# Patient Record
Sex: Female | Born: 1980 | Race: White | Hispanic: No | Marital: Single | Smoking: Never smoker
Health system: Southern US, Community
[De-identification: ages and names within clinical notes are randomized; demographics above are authoritative.]

---

## 2003-04-16 ENCOUNTER — Other Ambulatory Visit: Admission: RE | Admit: 2003-04-16 | Discharge: 2003-04-16 | Payer: Self-pay | Admitting: Gynecology

## 2004-06-17 ENCOUNTER — Other Ambulatory Visit: Admission: RE | Admit: 2004-06-17 | Discharge: 2004-06-17 | Payer: Self-pay | Admitting: Gynecology

## 2005-07-13 ENCOUNTER — Other Ambulatory Visit: Admission: RE | Admit: 2005-07-13 | Discharge: 2005-07-13 | Payer: Self-pay | Admitting: Gynecology

## 2006-10-22 ENCOUNTER — Emergency Department (HOSPITAL_COMMUNITY): Admission: EM | Admit: 2006-10-22 | Discharge: 2006-10-22 | Payer: Self-pay | Admitting: Emergency Medicine

## 2010-03-29 ENCOUNTER — Inpatient Hospital Stay (HOSPITAL_COMMUNITY)
Admission: AD | Admit: 2010-03-29 | Discharge: 2010-04-02 | Payer: Self-pay | Source: Home / Self Care | Attending: Obstetrics and Gynecology | Admitting: Obstetrics and Gynecology

## 2010-03-30 ENCOUNTER — Encounter (INDEPENDENT_AMBULATORY_CARE_PROVIDER_SITE_OTHER): Payer: Self-pay | Admitting: Obstetrics and Gynecology

## 2010-03-31 LAB — CBC
HCT: 37.1 % (ref 36.0–46.0)
HCT: 34.4 % — ABNORMAL LOW (ref 36.0–46.0)
Hemoglobin: 12.8 g/dL (ref 12.0–15.0)
Hemoglobin: 11.6 g/dL — ABNORMAL LOW (ref 12.0–15.0)
MCH: 31 pg (ref 26.0–34.0)
MCH: 30.4 pg (ref 26.0–34.0)
MCHC: 34.5 g/dL (ref 30.0–36.0)
MCHC: 33.7 g/dL (ref 30.0–36.0)
MCV: 89.8 fL (ref 78.0–100.0)
MCV: 90.1 fL (ref 78.0–100.0)
Platelets: 208 10*3/uL (ref 150–400)
Platelets: 194 10*3/uL (ref 150–400)
RBC: 4.13 MIL/uL (ref 3.87–5.11)
RBC: 3.82 MIL/uL — ABNORMAL LOW (ref 3.87–5.11)
RDW: 13.9 % (ref 11.5–15.5)
RDW: 14.1 % (ref 11.5–15.5)
WBC: 11.5 10*3/uL — ABNORMAL HIGH (ref 4.0–10.5)
WBC: 13.2 10*3/uL — ABNORMAL HIGH (ref 4.0–10.5)

## 2010-03-31 LAB — COMPREHENSIVE METABOLIC PANEL
ALT: 12 U/L (ref 0–35)
AST: 18 U/L (ref 0–37)
Albumin: 2.9 g/dL — ABNORMAL LOW (ref 3.5–5.2)
Alkaline Phosphatase: 146 U/L — ABNORMAL HIGH (ref 39–117)
BUN: 7 mg/dL (ref 6–23)
CO2: 23 mEq/L (ref 19–32)
Calcium: 9 mg/dL (ref 8.4–10.5)
Chloride: 106 mEq/L (ref 96–112)
Creatinine, Ser: 0.57 mg/dL (ref 0.4–1.2)
GFR calc Af Amer: >60 mL/min (ref >60)
GFR calc non Af Amer: >60 mL/min (ref >60)
Glucose, Bld: 88 mg/dL (ref 70–99)
Potassium: 3.8 mEq/L (ref 3.5–5.1)
Sodium: 136 mEq/L (ref 135–145)
Total Bilirubin: 0.4 mg/dL (ref 0.3–1.2)
Total Protein: 6.3 g/dL (ref 6.0–8.3)

## 2010-03-31 LAB — LACTATE DEHYDROGENASE: LDH: 119 U/L (ref 94–250)

## 2010-03-31 LAB — RPR: RPR Ser Ql: NONREACTIVE

## 2010-03-31 LAB — URIC ACID: Uric Acid, Serum: 4.3 mg/dL (ref 2.4–7.0)

## 2010-04-05 LAB — CBC
HCT: 35 % — ABNORMAL LOW (ref 36.0–46.0)
Hemoglobin: 11.5 g/dL — ABNORMAL LOW (ref 12.0–15.0)
MCH: 30 pg (ref 26.0–34.0)
MCHC: 32.9 g/dL (ref 30.0–36.0)
MCV: 91.4 fL (ref 78.0–100.0)
Platelets: 193 10*3/uL (ref 150–400)
RBC: 3.83 MIL/uL — ABNORMAL LOW (ref 3.87–5.11)
RDW: 14.3 % (ref 11.5–15.5)
WBC: 13.1 10*3/uL — ABNORMAL HIGH (ref 4.0–10.5)

## 2010-04-08 NOTE — Discharge Summary (Addendum)
[name], [name]             ACCOUNT NO.:  [id number]  MEDICAL RECORD NO.:  [id number]          PATIENT TYPE:  INP  LOCATION:  9105                          FACILITY:  [facility]  PHYSICIAN:  Hal Morales, M.D.DATE OF BIRTH:  04/28/1980  DATE OF ADMISSION:  03/29/2010 DATE OF DISCHARGE:  04/02/2010                              DISCHARGE SUMMARY   ADMITTING DIAGNOSES: 1. Intrauterine pregnancy at 41 weeks. 2. Rupture of membranes before onset of labor. 3. 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 4. History depression. 5. Plans water birth.  DISCHARGE DIAGNOSES: 1. A 41 and 1/7th weeks. 2. Failure to progress. 3. Prolonged rupture of membranes.  PROCEDURES: 1. Primary low transverse cesarean section. 2. Spinal anesthesia.  HOSPITAL COURSE:  [name] is a 30 year old gravida 2, para 0-0-1-0 at 68 weeks' who presented on March 29, 2010, with rupture of membranes at home at 1 p.m. on March 28, 2010.  Fluid was clear.  She was having every 2-4 minute contractions that were mild-to-moderate.  Her pregnancy has been remarkable for: 1. History of HSV on Valtrex. 2. History of depression. 3. Cord plexus cyst that was resolved. 4. Planning for water birth.  Cervix on arrival was 1, 90% vertex, -2.  Rupture of membranes was verified.  There were no HSV lesions noted.  Cervix was firm and posterior.  Fetal heart rate was reactive.  Risks and benefits of expectant management versus augmentation reviewed.  Patient chose to expectantly manage.  She was considering the Foley bulb.  This was placed early on the morning of March 29, 2010.  By 8:30 Foley was still in place.  She tried nipple stimulation with a breast pump.  She declined Cytotec and at that time she also declined Pitocin.  By 2:15 in the afternoon, she was tearful and had decided to start Pitocin.  By 6:30 p.m. that day she had been ruptured 29-1/2 hours.  Pitocin had been initiated.  Foley was still in  place.  Foley bulb was removed when it was noted to be extruding at 10:50 p.m.  She was 3-4, 90% vertex, -2. There was compound arm presentation noted.  Fetal heart rate remained reactive.  She was still working towards a Systems developer.  This plan was reviewed with Dr. Estanislado Pandy.  At 1:15, the patient had been ruptured 36 hours at that time. Contractions were still mild.  Plan of care was reviewed for the patient including the patient's compound arm presentation.  The option for high-dose Pitocin and the option also for cesarean were reviewed.  The patient wished to proceed with high-dose Pitocin, and this was initiated.  However, by 4 a.m., she had not made any change.  There were some mild variables noted.  She had been ruptured by this time 39 hours, and the patient wished to proceed with cesarean section.  She was taken to the operating room by Dr. Estanislado Pandy where a primary low transverse cesarean section was performed under spinal anesthesia for a viable female, named "[name]", weight 8 pounds 6 ounces, Apgars were 9 and 9.  Infant was taken to the full-term nursery.  Mother was taken to  recovery in good condition.  By postop day #1, patient is doing well.  She was up ad lib.  She was breast-feeding.  Hemoglobin was 11.5 down from 11.6, white blood cell count was 13.1.  The patient was afebrile and physical exam was within normal limits.  The rest of her hospital stay was uncomplicated.  By postop day #3, on April 02, 2010, she was doing well.  Breast feeding was going better.  Her incision was clean, dry, and intact.  Vital signs were stable.  She was tolerating a regular diet.  She was up ad lib without any syncope or dizziness.  She declined any contraception decision at this time.  She was deemed to receive full benefit of her hospital stay and was discharged home.  DISCHARGE INSTRUCTIONS:  Per [facility] handout.  DISCHARGE MEDICATIONS: 1. Motrin 600 mg p.o. q.6 hours  p.r.n. pain. 2. Percocet 5/325 one to two p.o. 3-4 hours p.r.n. pain.  DISCHARGE FOLLOWUP:  Will occur in 6 weeks [facility] or p.r.n.     Renaldo Reel Emilee Hero, C.N.M.   ______________________________ Hal Morales, M.D.    VLL/MEDQ  D:  04/02/2010  T:  04/03/2010  Job:  [id number]  Electronically Signed by Nigel Bridgeman C.N.M. on 04/03/2010 05:37:53 PM Electronically Signed by Dierdre Forth M.D. on 04/08/2010 03:25:57 PM

## 2010-04-11 NOTE — Op Note (Signed)
NAMESTEPHANNY, [name]             ACCOUNT NO.:  [id number]  MEDICAL RECORD NO.:  [id number]          PATIENT TYPE:  INP  LOCATION:  9161                          FACILITY:  [facility]  PHYSICIAN:  Crist Fat. Moyinoluwa Dawe, M.D. DATE OF BIRTH:  [birth year]  DATE OF PROCEDURE:  03/30/2010 DATE OF DISCHARGE:                              OPERATIVE REPORT   PREOPERATIVE DIAGNOSES:  A 41 weeks and 1 day failure to progress, prolonged rupture of membranes.  POSTOPERATIVE DIAGNOSES:  A 41 weeks and 1 day failure to progress, prolonged rupture of membranes.  ANESTHESIA:  Spinal, Brayton Caves, MD  PROCEDURE:  Primary low transverse cesarean section.  SURGEON:  Crist Fat. Wynonia Medero, MD  ASSISTANT:  Dorathy Kinsman, certified nurse midwife.  ESTIMATED BLOOD LOSS:  700 mL.  PROCEDURE:  After being informed of the planned procedure with possible complications including bleeding, infection, injury to other organs, informed consent was obtained.  The patient was taken to OR #1 and given spinal anesthesia without any complication.  She was then placed in the dorsal decubitus position, pelvis tilted to the left, prepped and draped in a sterile fashion and a Foley catheter was inserted in the bladder. After assessing adequate level of anesthesia, we infiltrated the suprapubic area with 20 mL of Marcaine 0.25 and performed a Pfannenstiel incision which was brought down sharply to the fascia.  The fascia was incised in a low transverse fashion.  Linea alba was dissected. Peritoneum was entered bluntly.  An Alexis retractor was easily positioned and visceral peritoneum was entered in a low transverse fashion allowing [place] to safely retract bladder by developing a bladder flap.  Myometrium was then entered in a low transverse fashion first with knife then extended bluntly.  Amniotic fluid was clear.  We assist the birth of a female infant in vertex presentation with right arm overhead at 5:24 a.m.  Mouth and  nose were suctioned.  Baby was delivered.  Cord was clamped with 2 Kelly clamps and sectioned and the baby was given to the neonatologist present in the room.  A 10 mL of blood was drawn from the umbilical vein and the placenta was allowed to deliver spontaneously. It was complete.  Cord has 3 vessels and uterine revision was completely negative.  Myometrium was then closed in 2 layers, first with a running lock suture of 0 Vicryl, then with a Lembert suture of 0 Vicryl imbricating the first one.  Hemostasis was completed on peritoneal edges using cauterization.  Both paracolic gutters were cleaned.  Both tubes and ovaries assessed and normal.  We do note a small pedunculated fundal fibroid measuring 1-cm.  Pelvis was then profusely irrigated with warm saline to confirm satisfactory hemostasis.  Retractors and sponges were removed under fascia. Hemostasis was completed with cauterization and the fascia was closed with 2 running sutures of 1 Vicryl meeting midline.  The wound was irrigated with warm saline.  Hemostasis was completed with cauterization and the skin was closed with a subcuticular suture of 3-0 Monocryl and Steri-Strips.  Instruments and sponge count was complete x2.  Estimated blood loss was 700 mL.  The procedure was well  tolerated by the patient who was taken to recovery room in a well and stable condition.  Little girl named [name] was born at 5:24 a.m., received an Apgar of 9 at one minute, 9 at five minutes and weighed 8 pounds 6 ounces.  SPECIMEN:  Placenta to pathology.     Crist Fat Jessiah Wojnar, M.D.     SAR/MEDQ  D:  03/30/2010  T:  03/30/2010  Job:  [id number]  Electronically Signed by Silverio Lay M.D. on 04/11/2010 11:02:33 AM

## 2013-01-12 ENCOUNTER — Encounter (HOSPITAL_COMMUNITY): Payer: Self-pay | Admitting: Emergency Medicine

## 2013-01-12 ENCOUNTER — Emergency Department (HOSPITAL_COMMUNITY)
Admission: EM | Admit: 2013-01-12 | Discharge: 2013-01-12 | Disposition: A | Discharge status: Home / Self Care | Payer: Managed Care, Other (non HMO) | Attending: Emergency Medicine | Admitting: Emergency Medicine

## 2013-01-12 DIAGNOSIS — R0602 Shortness of breath: Secondary | ICD-10-CM | POA: Insufficient documentation

## 2013-01-12 DIAGNOSIS — R0789 Other chest pain: Secondary | ICD-10-CM | POA: Insufficient documentation

## 2013-01-12 DIAGNOSIS — R42 Dizziness and giddiness: Secondary | ICD-10-CM | POA: Insufficient documentation

## 2013-01-12 LAB — COMPREHENSIVE METABOLIC PANEL
ALT: 16 U/L (ref 0–35)
AST: 25 U/L (ref 0–37)
Albumin: 4.7 g/dL (ref 3.5–5.2)
Alkaline Phosphatase: 57 U/L (ref 39–117)
BUN: 15 mg/dL (ref 6–23)
CO2: 26 mEq/L (ref 19–32)
Calcium: 10 mg/dL (ref 8.4–10.5)
Chloride: 102 mEq/L (ref 96–112)
Creatinine, Ser: 0.66 mg/dL (ref 0.50–1.10)
GFR calc Af Amer: >90 mL/min (ref >90)
GFR calc non Af Amer: >90 mL/min (ref >90)
Glucose, Bld: 98 mg/dL (ref 70–99)
Potassium: 3.5 mEq/L (ref 3.5–5.1)
Sodium: 139 mEq/L (ref 135–145)
Total Bilirubin: 0.3 mg/dL (ref 0.3–1.2)
Total Protein: 7.7 g/dL (ref 6.0–8.3)

## 2013-01-12 LAB — POCT I-STAT TROPONIN I: Troponin i, poc: 0 ng/mL (ref 0.00–0.08)

## 2013-01-12 LAB — CBC
HCT: 44.3 % (ref 36.0–46.0)
Hemoglobin: 15.3 g/dL — ABNORMAL HIGH (ref 12.0–15.0)
MCH: 31 pg (ref 26.0–34.0)
MCHC: 34.5 g/dL (ref 30.0–36.0)
MCV: 89.7 fL (ref 78.0–100.0)
Platelets: 270 10*3/uL (ref 150–400)
RBC: 4.94 MIL/uL (ref 3.87–5.11)
RDW: 12.6 % (ref 11.5–15.5)
WBC: 9.2 10*3/uL (ref 4.0–10.5)

## 2013-01-12 MED ORDER — ONDANSETRON HCL 4 MG/2ML IJ SOLN
4.0000 mg | Freq: Once | INTRAMUSCULAR | Status: DC
  Filled 2013-01-12: qty 2

## 2013-01-12 NOTE — ED Provider Notes (Signed)
Medical screening examination/treatment/procedure(s) were performed by non-physician practitioner and as supervising physician I was immediately available for consultation/collaboration.  EKG Interpretation     Ventricular Rate:  90 PR Interval:  134 QRS Duration: 76 QT Interval:  364 QTC Calculation: 445 R Axis:   80 Text Interpretation:  Normal sinus rhythm with sinus arrhythmia Biatrial enlargement Abnormal ECG No prior             Ethelda Chick, MD 01/12/13 2102

## 2013-01-12 NOTE — ED Provider Notes (Signed)
CSN: [id number]     Arrival date & time 01/12/13  1810 History   First MD Initiated Contact with Patient 01/12/13 1848     Chief Complaint  Patient presents with  . Chest Pain   (Consider location/radiation/quality/duration/timing/severity/associated sxs/prior Treatment) Patient is a 32 y.o. female presenting with chest pain. The history is provided by the patient and medical records. No language interpreter was used.  Chest Pain Associated symptoms: shortness of breath   Associated symptoms: no abdominal pain, no back pain, no cough, no diaphoresis, no fatigue, no fever, no headache, no nausea and not vomiting     [name] is a 32 y.o. female  with no major medical history presents to the Emergency Department complaining of gradual, persistent, progressively worsening chest pressure with associated shortness of breath and lightheadedness onset approximately 10:30 after completing across the workout. She reports that she was not is well-hydrated his usual prior to workout and felt tired throughout. She states afterward she had shortness of breath and felt as if  Her heart was racing. S 4:30 this afternoon when it resolves spontaneously.  She denies personal or family cardiac history. She is not a smoker, is not obese and does not take any birth control. She denies swelling of her legs, hemoptysis, abdominal pain, nausea, vomiting, syncopal episode, fever, chills.  She reports she has been drinking water at home which she feels is made her symptoms some better. Nothing seems to make them worse.  History reviewed. No pertinent past medical history. Past Surgical History  Procedure Laterality Date  . Cesarean section     History reviewed. No pertinent family history. History  Substance Use Topics  . Smoking status: Never Smoker   . Smokeless tobacco: Never Used  . Alcohol Use: Yes     Comment: rarely   OB History   Grav Para Term Preterm Abortions TAB SAB Ect Mult Living          Review of Systems  Constitutional: Negative for fever, diaphoresis, appetite change, fatigue and unexpected weight change.  HENT: Negative for mouth sores.   Eyes: Negative for visual disturbance.  Respiratory: Positive for chest tightness and shortness of breath. Negative for cough and wheezing.   Cardiovascular: Positive for chest pain.  Gastrointestinal: Negative for nausea, vomiting, abdominal pain, diarrhea and constipation.  Endocrine: Negative for polydipsia, polyphagia and polyuria.  Genitourinary: Negative for dysuria, urgency, frequency and hematuria.  Musculoskeletal: Negative for back pain and neck stiffness.  Skin: Negative for rash.  Allergic/Immunologic: Negative for immunocompromised state.  Neurological: Positive for light-headedness. Negative for syncope and headaches.  Hematological: Does not bruise/bleed easily.  Psychiatric/Behavioral: Negative for sleep disturbance. The patient is not nervous/anxious.     Allergies  Morphine and related  Home Medications  No current outpatient prescriptions on file. BP 128/89  Pulse 76  Temp(Src) 97.8 F (36.6 C) (Oral)  Resp 18  Ht 5\' 6"  (1.676 m)  Wt 150 lb 1.6 oz (68.085 kg)  BMI 24.24 kg/m2  SpO2 99%  LMP 12/27/2012 Physical Exam  Nursing note and vitals reviewed. Constitutional: She appears well-developed and well-nourished. No distress.  Awake, alert, nontoxic appearance  HENT:  Head: Normocephalic and atraumatic.  Mouth/Throat: Oropharynx is clear and moist. No oropharyngeal exudate.  Eyes: Conjunctivae are normal. No scleral icterus.  Neck: Normal range of motion. Neck supple.  Cardiovascular: Normal rate, regular rhythm, normal heart sounds and intact distal pulses.  Exam reveals no gallop and no friction rub.   No murmur  heard. No tachycardia  Pulmonary/Chest: Effort normal and breath sounds normal. No accessory muscle usage. Not tachypneic. No respiratory distress. She has no decreased breath  sounds. She has no wheezes. She has no rhonchi. She has no rales. She exhibits no tenderness.  No increased respiratory effort or accessory muscle use  Abdominal: Soft. Normal appearance and bowel sounds are normal. She exhibits no distension and no mass. There is no tenderness. There is no rebound and no guarding.  Musculoskeletal: Normal range of motion. She exhibits no edema.  Neurological: She is alert.  Speech is clear and goal oriented Moves extremities without ataxia  Skin: Skin is warm and dry. She is not diaphoretic.  Psychiatric: She has a normal mood and affect.    ED Course  Procedures (including critical care time) Labs Review Labs Reviewed  CBC - Abnormal; Notable for the following:    Hemoglobin 15.3 (*)    All other components within normal limits  COMPREHENSIVE METABOLIC PANEL  POCT I-STAT TROPONIN I   Imaging Review No results found.  EKG Interpretation     Ventricular Rate:  90 PR Interval:  134 QRS Duration: 76 QT Interval:  364 QTC Calculation: 445 R Axis:   80 Text Interpretation:  Normal sinus rhythm with sinus arrhythmia Biatrial enlargement Abnormal ECG No prior            MDM   1. Atypical chest pain       [name] presents with chest tightness and lightheadedness after difficult workout this morning, but now resolved. Patient is Percocet negative, non-tachycardic; doubt PE.  Atypical chest pain, but will check labs.    8:42 PM Pt remains symptoms free, resting comfortably in the room.  Chest pain is not likely of cardiac or pulmonary etiology d/t presentation, perc negative, VSS, no tracheal deviation, no JVD or new murmur, RRR, breath sounds equal bilaterally, EKG without acute abnormalities, negative troponin. Pt without current SOB, hypoxia or abnormal breath sounds.  Doubt pneumonia; No CXR at this time. Pt has been advised to hydrate, refrain from strenuous activity for the next several days, follow-up with her PCP and  return to the ED is CP becomes exertional, associated with diaphoresis or nausea, radiates to left jaw/arm, worsens or becomes concerning in any way. Pt appears reliable for follow up and is agreeable to discharge.   Case has been discussed with Dr. Karma Ganja who agrees with the above plan to discharge.   BP 128/89  Pulse 76  Temp(Src) 97.8 F (36.6 C) (Oral)  Resp 18  Ht 5\' 6"  (1.676 m)  Wt 150 lb 1.6 oz (68.085 kg)  BMI 24.24 kg/m2  SpO2 99%  LMP 12/27/2012     Dierdre Forth, PA-C 01/12/13 1610

## 2013-01-12 NOTE — ED Notes (Signed)
Pt states the pain in her chest has passed.  Pt reports working out today when the symptoms began.

## 2013-01-12 NOTE — ED Notes (Signed)
Patient stated that she worked out really hard today and since then she has been SOB, feeling like her heart is racing.  +nausea, SOB, dizziness

## 2013-01-12 NOTE — ED Notes (Signed)
Pt no longer nauseated and does not wish to have any zofran at this time

## 2013-10-07 ENCOUNTER — Other Ambulatory Visit: Payer: Self-pay | Admitting: Obstetrics and Gynecology

## 2013-10-07 DIAGNOSIS — N63 Unspecified lump in unspecified breast: Secondary | ICD-10-CM

## 2013-10-07 DIAGNOSIS — N6459 Other signs and symptoms in breast: Secondary | ICD-10-CM

## 2013-10-10 ENCOUNTER — Ambulatory Visit
Admission: RE | Admit: 2013-10-10 | Discharge: 2013-10-10 | Disposition: A | Discharge status: Home / Self Care | Payer: Managed Care, Other (non HMO) | Source: Ambulatory Visit | Attending: Obstetrics and Gynecology | Admitting: Obstetrics and Gynecology

## 2013-10-10 ENCOUNTER — Encounter (INDEPENDENT_AMBULATORY_CARE_PROVIDER_SITE_OTHER): Payer: Self-pay

## 2013-10-10 DIAGNOSIS — N6459 Other signs and symptoms in breast: Secondary | ICD-10-CM

## 2013-10-10 DIAGNOSIS — N63 Unspecified lump in unspecified breast: Secondary | ICD-10-CM

## 2014-02-04 ENCOUNTER — Encounter (HOSPITAL_COMMUNITY): Payer: Self-pay

## 2014-02-04 ENCOUNTER — Other Ambulatory Visit: Payer: Self-pay | Admitting: Obstetrics and Gynecology

## 2014-02-05 ENCOUNTER — Ambulatory Visit (HOSPITAL_COMMUNITY): Payer: Managed Care, Other (non HMO) | Admitting: Anesthesiology

## 2014-02-05 ENCOUNTER — Encounter (HOSPITAL_COMMUNITY): Payer: Self-pay | Admitting: Anesthesiology

## 2014-02-05 ENCOUNTER — Ambulatory Visit (HOSPITAL_COMMUNITY)
Admission: RE | Admit: 2014-02-05 | Discharge: 2014-02-05 | Disposition: A | Discharge status: Home / Self Care | Payer: Managed Care, Other (non HMO) | Source: Ambulatory Visit | Attending: Obstetrics and Gynecology | Admitting: Obstetrics and Gynecology

## 2014-02-05 ENCOUNTER — Encounter (HOSPITAL_COMMUNITY)
Admission: RE | Disposition: A | Discharge status: Home / Self Care | Payer: Self-pay | Source: Ambulatory Visit | Attending: Obstetrics and Gynecology

## 2014-02-05 DIAGNOSIS — O021 Missed abortion: Secondary | ICD-10-CM | POA: Diagnosis present

## 2014-02-05 DIAGNOSIS — F1990 Other psychoactive substance use, unspecified, uncomplicated: Secondary | ICD-10-CM | POA: Diagnosis not present

## 2014-02-05 HISTORY — PX: DILATION AND CURETTAGE OF UTERUS: SHX78

## 2014-02-05 LAB — CBC
HCT: 41.8 % (ref 36.0–46.0)
Hemoglobin: 14.2 g/dL (ref 12.0–15.0)
MCH: 31 pg (ref 26.0–34.0)
MCHC: 34 g/dL (ref 30.0–36.0)
MCV: 91.3 fL (ref 78.0–100.0)
Platelets: 231 10*3/uL (ref 150–400)
RBC: 4.58 MIL/uL (ref 3.87–5.11)
RDW: 13.3 % (ref 11.5–15.5)
WBC: 8.9 10*3/uL (ref 4.0–10.5)

## 2014-02-05 SURGERY — DILATION AND CURETTAGE
Anesthesia: Monitor Anesthesia Care | Site: Vagina

## 2014-02-05 MED ORDER — BUPIVACAINE-EPINEPHRINE (PF) 0.5% -1:200000 IJ SOLN
INTRAMUSCULAR | Status: AC
  Filled 2014-02-05: qty 30

## 2014-02-05 MED ORDER — LACTATED RINGERS IV SOLN
INTRAVENOUS | Status: DC
  Administered 2014-02-05 (×2): via INTRAVENOUS

## 2014-02-05 MED ORDER — FENTANYL CITRATE 0.05 MG/ML IJ SOLN
INTRAMUSCULAR | Status: DC | PRN
  Administered 2014-02-05 (×2): 50 ug via INTRAVENOUS

## 2014-02-05 MED ORDER — DEXAMETHASONE SODIUM PHOSPHATE 10 MG/ML IJ SOLN
INTRAMUSCULAR | Status: DC | PRN
  Administered 2014-02-05: 4 mg via INTRAVENOUS

## 2014-02-05 MED ORDER — BUPIVACAINE-EPINEPHRINE 0.5% -1:200000 IJ SOLN
INTRAMUSCULAR | Status: DC | PRN
  Administered 2014-02-05: 10 mL

## 2014-02-05 MED ORDER — MEPERIDINE HCL 25 MG/ML IJ SOLN
6.2500 mg | INTRAMUSCULAR | Status: DC | PRN
Start: 2014-02-05 — End: 2014-02-05

## 2014-02-05 MED ORDER — METOCLOPRAMIDE HCL 5 MG/ML IJ SOLN: 10.0000 mg | Freq: Once | INTRAMUSCULAR | Status: DC | PRN

## 2014-02-05 MED ORDER — SCOPOLAMINE 1 MG/3DAYS TD PT72
1.0000 | MEDICATED_PATCH | Freq: Once | TRANSDERMAL | Status: DC
  Administered 2014-02-05: 1.5 mg via TRANSDERMAL

## 2014-02-05 MED ORDER — ONDANSETRON HCL 4 MG/2ML IJ SOLN
INTRAMUSCULAR | Status: DC | PRN
  Administered 2014-02-05: 4 mg via INTRAVENOUS

## 2014-02-05 MED ORDER — KETOROLAC TROMETHAMINE 30 MG/ML IJ SOLN
INTRAMUSCULAR | Status: DC | PRN
  Administered 2014-02-05: 25 mg via INTRAMUSCULAR
  Administered 2014-02-05: 25 mg via INTRAVENOUS

## 2014-02-05 MED ORDER — PROPOFOL 10 MG/ML IV EMUL
INTRAVENOUS | Status: DC | PRN
  Administered 2014-02-05: 150 mg via INTRAVENOUS
  Administered 2014-02-05: 50 mg via INTRAVENOUS

## 2014-02-05 MED ORDER — PROMETHAZINE HCL 12.5 MG PO TABS: 12.5000 mg | ORAL_TABLET | Freq: Four times a day (QID) | ORAL | Status: DC | PRN

## 2014-02-05 MED ORDER — FENTANYL CITRATE 0.05 MG/ML IJ SOLN
25.0000 ug | INTRAMUSCULAR | Status: DC | PRN
Start: 2014-02-05 — End: 2014-02-05

## 2014-02-05 MED ORDER — FENTANYL CITRATE 0.05 MG/ML IJ SOLN
INTRAMUSCULAR | Status: AC
  Filled 2014-02-05: qty 2

## 2014-02-05 MED ORDER — OXYCODONE-ACETAMINOPHEN 5-325 MG PO TABS: 1.0000 | ORAL_TABLET | ORAL | Status: DC | PRN

## 2014-02-05 MED ORDER — DEXAMETHASONE SODIUM PHOSPHATE 4 MG/ML IJ SOLN
INTRAMUSCULAR | Status: AC
  Filled 2014-02-05: qty 1

## 2014-02-05 MED ORDER — PROPOFOL 10 MG/ML IV EMUL
INTRAVENOUS | Status: AC
  Filled 2014-02-05: qty 20

## 2014-02-05 MED ORDER — KETOROLAC TROMETHAMINE 30 MG/ML IJ SOLN
INTRAMUSCULAR | Status: AC
  Filled 2014-02-05: qty 2

## 2014-02-05 MED ORDER — SCOPOLAMINE 1 MG/3DAYS TD PT72
MEDICATED_PATCH | TRANSDERMAL | Status: AC
  Administered 2014-02-05: 1.5 mg via TRANSDERMAL
  Filled 2014-02-05: qty 1

## 2014-02-05 MED ORDER — IBUPROFEN 800 MG PO TABS: 800.0000 mg | ORAL_TABLET | Freq: Three times a day (TID) | ORAL | Status: DC | PRN

## 2014-02-05 MED ORDER — MIDAZOLAM HCL 2 MG/2ML IJ SOLN
INTRAMUSCULAR | Status: DC | PRN
  Administered 2014-02-05: 2 mg via INTRAVENOUS

## 2014-02-05 MED ORDER — MIDAZOLAM HCL 2 MG/2ML IJ SOLN
INTRAMUSCULAR | Status: AC
  Filled 2014-02-05: qty 2

## 2014-02-05 MED ORDER — LIDOCAINE HCL (CARDIAC) 20 MG/ML IV SOLN
INTRAVENOUS | Status: AC
  Filled 2014-02-05: qty 5

## 2014-02-05 SURGICAL SUPPLY — 14 items
CATH ROBINSON RED A/P 16FR (CATHETERS) ×3 IMPLANT
CLOTH BEACON ORANGE TIMEOUT ST (SAFETY) ×3 IMPLANT
CONTAINER PREFILL 10% NBF 60ML (FORM) ×2 IMPLANT
DECANTER SPIKE VIAL GLASS SM (MISCELLANEOUS) ×3 IMPLANT
GLOVE BIOGEL PI IND STRL 8.5 (GLOVE) ×1 IMPLANT
GLOVE BIOGEL PI INDICATOR 8.5 (GLOVE) ×2
GLOVE ECLIPSE 8.0 STRL XLNG CF (GLOVE) ×6 IMPLANT
GOWN STRL REUS W/TWL LRG LVL3 (GOWN DISPOSABLE) ×6 IMPLANT
PACK VAGINAL MINOR WOMEN LF (CUSTOM PROCEDURE TRAY) ×3 IMPLANT
PAD OB MATERNITY 4.3X12.25 (PERSONAL CARE ITEMS) ×3 IMPLANT
PAD PREP 24X48 CUFFED NSTRL (MISCELLANEOUS) ×3 IMPLANT
TOWEL OR 17X24 6PK STRL BLUE (TOWEL DISPOSABLE) ×6 IMPLANT
VACURETTE 8 RIGID CVD (CANNULA) ×2 IMPLANT
WATER STERILE IRR 1000ML POUR (IV SOLUTION) ×3 IMPLANT

## 2014-02-05 NOTE — Anesthesia Postprocedure Evaluation (Signed)
  Anesthesia Post-op Note  Patient: [name]  Procedure(s) Performed: Procedure(s): Suction DILATATION AND CURETTAGE (N/A)  Patient Location: PACU  Anesthesia Type:MAC  Level of Consciousness: awake, alert  and oriented  Airway and Oxygen Therapy: Patient Spontanous Breathing  Post-op Pain: none  Post-op Assessment: Post-op Vital signs reviewed, Patient's Cardiovascular Status Stable, Respiratory Function Stable, Patent Airway, No signs of Nausea or vomiting and Pain level controlled  Post-op Vital Signs: Reviewed and stable  Last Vitals:  Filed Vitals:   02/05/14 1130  BP: 95/60  Pulse: 61  Temp: 36.7 C  Resp: 16    Complications: No apparent anesthesia complications

## 2014-02-05 NOTE — Anesthesia Procedure Notes (Signed)
Procedure Name: MAC Date/Time: 02/05/2014 9:13 AM Performed by: Rabecca Birge, Jannet AskewHARLESETTA M Pre-anesthesia Checklist: Patient identified, Patient being monitored, Emergency Drugs available, Timeout performed and Suction available Oxygen Delivery Method: Nasal cannula Preoxygenation: Pre-oxygenation with 100% oxygen Intubation Type: IV induction

## 2014-02-05 NOTE — Discharge Instructions (Signed)
Dilation and Curettage or Vacuum Curettage, Care After These instructions give you information on caring for yourself after your procedure. Your doctor may also give you more specific instructions. Call your doctor if you have any problems or questions after your procedure. HOME CARE  Do not drive for 24 hours.  Wait 1 week before doing any activities that wear you out.  Take your temperature 2 times a day for 4 days. Write it down. Tell your doctor if you have a fever.  Do not stand for a long time.  Do not lift, push, or pull anything over 10 pounds (4.5 kilograms).  Limit stair climbing to once or twice a day.  Rest often.  Continue with your usual diet.  Drink enough fluids to keep your pee (urine) clear or pale yellow.  If you have a hard time pooping (constipation), you may:  Take a medicine to help you go poop (laxative) as told by your doctor.  Eat more fruit and bran.  Drink more fluids.  Take showers, not baths, for as long as told by your doctor.  Do not swim or use a hot tub until your doctor says it is okay.  Have someone with you for 1-2 days after the procedure.  Do not douche, use tampons, or have sex (intercourse) for 2 weeks.  Only take medicines as told by your doctor. Do not take aspirin. It can cause bleeding.  Keep all doctor visits. GET HELP IF:  You have cramps or pain not helped by medicine.  You have new pain in the belly (abdomen).  You have a bad smelling fluid coming from your vagina.  You have a rash.  You have problems with any medicine. GET HELP RIGHT AWAY IF:   You start to bleed more than a regular period.  You have a fever.  You have chest pain.  You have trouble breathing.  You feel dizzy or feel like passing out (fainting).  You pass out.  You have pain in the tops of your shoulders.  You have vaginal bleeding with or without clumps of blood (blood clots). MAKE SURE YOU:  Understand these instructions.  Will  watch your condition.  Will get help right away if you are not doing well or get worse. Document Released: 12/08/2007 Document Revised: 03/05/2013 Document Reviewed: 09/27/2012 [facility] Patient Information 2015 [place], [place]. This information is not intended to replace advice given to you by your health care provider. Make sure you discuss any questions you have with your health care provider. Miscarriage A miscarriage is the loss of an unborn baby (fetus) before the 20th week of pregnancy. The cause is often unknown.  HOME CARE  You may need to stay in bed (bed rest), or you may be able to do light activity. Go about activity as told by your doctor.  Have help at home.  Write down how many pads you use each day. Write down how soaked they are.  Do not use tampons. Do not wash out your vagina (douche) or have sex (intercourse) until your doctor approves.  Only take medicine as told by your doctor.  Do not take aspirin.  Keep all doctor visits as told.  If you or your partner have problems with grieving, talk to your doctor. You can also try counseling. Give yourself time to grieve before trying to get pregnant again. GET HELP RIGHT AWAY IF:  You have bad cramps or pain in your back or belly (abdomen).  You have a  fever.  You pass large clumps of blood (clots) from your vagina that are walnut-sized or larger. Save the clumps for your doctor to see.  You pass large amounts of tissue from your vagina. Save the tissue for your doctor to see.  You have more bleeding.  You have thick, bad-smelling fluid (discharge) coming from the vagina.  You get lightheaded, weak, or you pass out (faint).  You have chills. MAKE SURE YOU:  Understand these instructions.  Will watch your condition.  Will get help right away if you are not doing well or get worse. Document Released: 05/23/2011 Document Reviewed: 05/23/2011 [facility] Patient Information 2015 [place], [place]. This information  is not intended to replace advice given to you by your health care provider. Make sure you discuss any questions you have with your health care provider.  Post Anesthesia Home Care Instructions  Activity: Get plenty of rest for the remainder of the day. A responsible adult should stay with you for 24 hours following the procedure.  For the next 24 hours, DO NOT: -Drive a car -Advertising copywriterperate machinery -Drink alcoholic beverages -Take any medication unless instructed by your physician -Make any legal decisions or sign important papers.  Meals: Start with liquid foods such as gelatin or soup. Progress to regular foods as tolerated. Avoid greasy, spicy, heavy foods. If nausea and/or vomiting occur, drink only clear liquids until the nausea and/or vomiting subsides. Call your physician if vomiting continues.  Special Instructions/Symptoms: Your throat may feel dry or sore from the anesthesia or the breathing tube placed in your throat during surgery. If this causes discomfort, gargle with warm salt water. The discomfort should disappear within 24 hours.

## 2014-02-05 NOTE — Op Note (Signed)
OPERATIVE NOTE  [name]  DOB:    [birth year]  MRN:    [id number]  CSN:    [id number]  Date of Surgery:  02/05/2014  Preoperative Diagnosis:  Missed Abortion in the first trimester  Postoperative Diagnosis:  Missed Abortion in the first trimester  Procedure:  FIRST TRIMESTER SUCTION DILATATION AND CURETTAGE  Surgeon:  Leonard SchwartzArthur Vernon Romelle Reiley, M.D.  Assistant:  None  Anesthetic:  Monitor Anesthesia Care  Disposition:  The patient is a 33 y.o. year old female, gravida 3 para 1-0-1-1, who presents with a nonviable gestation based on ultrasound and/or quantitative hCG values. She understands the indications for her surgical procedure as well as the alternative treatment options. She accepts the risk of, but not limited to, anesthetic complications, bleeding, infections, and possible damage to the surrounding organs.  Findings:  The patient's blood type is O+. A large amount of products of conception were removed from within a 10 weeks size uterus. No adnexal masses were appreciated.  Procedure:  The patient was taken to the operating room where a Monitor Anesthesia Care anesthetic was given. The patient's abdomen, perineum, and vagina were prepped with Betadine. The bladder was drained of urine. The patient was sterilely draped. Examination under anesthesia was performed. A paracervical block was placed using 10 cc of half percent Marcaine with epinephrine. The uterus sounded to 9 centimeters. The cervix was gently dilated. The uterine cavity was evacuated using a size 8 suction curet. The cavity was then cleaned using a medium sharp curet. The cavity was felt to be clean at the end of our procedure. The estimated blood loss was 25 cc. The uterus was reexamined and was noted to be firm. Hemostasis was adequate. Sponge And needle counts were correct. The patient tolerated her but her procedure well. She was awakened from her anesthetic without difficulty. She was  transported to the recovery room in stable condition. The products of conception were sent to pathology.  Discharge medications:  Motrin 800 mg one tablet every 8 hours as needed for mild to moderate pain Percocet one or 2 tablets every 6 hours as needed for severe pain Phenergan 12.5 mg one tablet every 6 hours as needed for nausea  Followup instructions:  The patient return to see Dr. Stefano GaulStringer in 2 weeks. She was given prescriptions for pain medications. She was given instructions for patients who've undergone the surgical procedure. She will call for questions or concerns.  Leonard SchwartzArthur Vernon Decarlos Empey, M.D.

## 2014-02-05 NOTE — Anesthesia Preprocedure Evaluation (Addendum)
Anesthesia Evaluation  Patient identified by MRN, date of birth, ID band Patient awake    Reviewed: Allergy & Precautions, H&P , NPO status , Patient's Chart, lab work & pertinent test results, reviewed documented beta blocker date and time   Airway Mallampati: II  TM Distance: >3 FB Neck ROM: Full    Dental no notable dental hx. (+) Teeth Intact   Pulmonary neg pulmonary ROS,  breath sounds clear to auscultation  Pulmonary exam normal       Cardiovascular negative cardio ROS  Rhythm:Regular Rate:Tachycardia     Neuro/Psych negative neurological ROS  negative psych ROS   GI/Hepatic negative GI ROS, Neg liver ROS,   Endo/Other  negative endocrine ROS  Renal/GU negative Renal ROS  negative genitourinary   Musculoskeletal negative musculoskeletal ROS (+)   Abdominal   Peds  Hematology negative hematology ROS (+)   Anesthesia Other Findings   Reproductive/Obstetrics Missed Ab                            Anesthesia Physical Anesthesia Plan  ASA: II  Anesthesia Plan: MAC   Post-op Pain Management:    Induction:   Airway Management Planned: Natural Airway and Simple Face Mask  Additional Equipment:   Intra-op Plan:   Post-operative Plan:   Informed Consent: I have reviewed the patients History and Physical, chart, labs and discussed the procedure including the risks, benefits and alternatives for the proposed anesthesia with the patient or authorized representative who has indicated his/her understanding and acceptance.     Plan Discussed with: CRNA, Anesthesiologist and Surgeon  Anesthesia Plan Comments:        Anesthesia Quick Evaluation

## 2014-02-05 NOTE — Transfer of Care (Signed)
Immediate Anesthesia Transfer of Care Note  Patient: [name]  Procedure(s) Performed: Procedure(s): Suction DILATATION AND CURETTAGE (N/A)  Patient Location: PACU  Anesthesia Type:MAC  Level of Consciousness: awake, alert  and oriented  Airway & Oxygen Therapy: Patient Spontanous Breathing and Patient connected to nasal cannula oxygen  Post-op Assessment: Report given to PACU RN and Post -op Vital signs reviewed and stable  Post vital signs: Reviewed and stable  Complications: No apparent anesthesia complications

## 2014-02-05 NOTE — H&P (Signed)
Admission History and Physical Exam for a Gynecology Patient  [name] is a 33 y.o. female, No obstetric history on file., who presents for a dilation and evacuation. [facility]. She has a first trimester missed abortion confirmed by ultrasound. She denies bleeding. Her blood type is O+.  OB History    No data available      History reviewed. No pertinent past medical history.  Prescriptions prior to admission  Medication Sig Dispense Refill Last Dose  . COD LIVER OIL PO Take 1 tablet by mouth daily.     . Docosahexaenoic Acid (DHA PO) Take 1 tablet by mouth daily.     . Prenatal Vit-Fe Fumarate-FA (PRENATAL MULTIVITAMIN) TABS tablet Take 1 tablet by mouth daily at 12 noon.       Past Surgical History  Procedure Laterality Date  . Cesarean section      Allergies  Allergen Reactions  . Morphine And Related Itching and Nausea And Vomiting    Family History: family history is not on file.  Social History:  reports that she has never smoked. She has never used smokeless tobacco. She reports that she drinks alcohol. She reports that she uses illicit drugs (Marijuana).  Review of systems: See HPI.  Admission Physical Exam:    Body mass index is 23.09 kg/(m^2).  Blood pressure 120/73, pulse 106, temperature 98.6 F (37 C), resp. rate 20, height 5\' 6"  (1.676 m), weight 143 lb (64.864 kg), last menstrual period 11/03/2013, SpO2 100 %.  HEENT:                 Within normal limits Chest:                   Clear Heart:                    Regular rate and rhythm Breasts:                No masses, skin changes, bleeding, or discharge present Abdomen:             Nontender, no masses Extremities:          Grossly normal Neurologic exam: Grossly normal  Pelvic exam:  External genitalia: normal general appearance Vaginal: normal without tenderness, induration or  masses Cervix: normal appearance Adnexa: normal bimanual exam Uterus: 8-10 week size  Pelvic examination by office staff.   Assessment:  First trimester missed abortion  Plan:  The patient will undergo a dilation and evacuation. She understands the indications for her procedure. Her alternative treatment options have been explained. She accepts the risk of, but not limited to, anesthetic complications, bleeding, infections, and possible damage to the surrounding organs.   Janine LimboSTRINGER,Bryndle Corredor V 02/05/2014

## 2014-02-06 ENCOUNTER — Encounter (HOSPITAL_COMMUNITY): Payer: Self-pay | Admitting: Obstetrics and Gynecology

## 2014-05-28 ENCOUNTER — Other Ambulatory Visit: Payer: Self-pay

## 2014-10-16 ENCOUNTER — Encounter: Payer: Self-pay | Admitting: Family Medicine

## 2014-10-16 ENCOUNTER — Ambulatory Visit (INDEPENDENT_AMBULATORY_CARE_PROVIDER_SITE_OTHER): Payer: Worker's Compensation | Admitting: Family Medicine

## 2014-10-16 VITALS — BP 102/68 | HR 50 | Temp 97.8°F | Resp 16

## 2014-10-16 DIAGNOSIS — S61012A Laceration without foreign body of left thumb without damage to nail, initial encounter: Secondary | ICD-10-CM

## 2014-10-16 DIAGNOSIS — S61002A Unspecified open wound of left thumb without damage to nail, initial encounter: Secondary | ICD-10-CM

## 2014-10-16 DIAGNOSIS — M79645 Pain in left finger(s): Secondary | ICD-10-CM | POA: Diagnosis not present

## 2014-10-16 MED ORDER — TRAMADOL HCL 50 MG PO TABS: 50.0000 mg | ORAL_TABLET | Freq: Three times a day (TID) | ORAL | Status: DC | PRN

## 2014-10-16 NOTE — Progress Notes (Signed)
Procedure Consent obtained. 1cc 1% lido local anesthesia. Cleaned with soap and water. Xeroform and clean dressing placed. Care instructions given.

## 2014-10-16 NOTE — Patient Instructions (Addendum)
Keep wound clean and dry  No tetanus vaccine is needed since she had it in the last 3 or 4 years  Return Sunday for a recheck by either Dr. Alwyn Ren or one of the physician assistants.  Take tramadol if needed for pain. It works extra well taken in combination with Tylenol.

## 2014-10-16 NOTE — Progress Notes (Signed)
  Subjective:  Patient ID: [name], female    DOB: [birth year]  Age: 34 y.o. MRN: [id number]  Patient was cutting some peppers with a sharp knife and cut the tip off her left thumb, taking a tiny piece of the nail and an area about 1 cm x 0.6 cm which she brought in with her. This happened at work.   Objective:   Small evulsion of skin from tip of thumb right at the tip of the nail. Most of it is just a callus skin that is removed, but there is one little deeper area that cut into the subcutaneous tissues. The skin that was brought in a so small I do not believe it can be probably be viably attached back on. Discussed treatment options and feel it is best just to clean and pack with some Xeroform gauze. I think that putting a foil  graft would cause more trauma than benefit.  Assessment & Plan:   Assessment: Wound left thumb  Plan: Clean after anesthetizing, dress with xeroform gauze.   There are no Patient Instructions on file for this visit.   Denece Shearer, MD 10/16/2014

## 2014-10-16 NOTE — Progress Notes (Deleted)
   Subjective:    Patient ID: [name], female    DOB: 11/21/1980, 34 y.o.   MRN: [id number]  HPI    Review of Systems     Objective:   Physical Exam        Assessment & Plan:

## 2014-10-19 ENCOUNTER — Ambulatory Visit (INDEPENDENT_AMBULATORY_CARE_PROVIDER_SITE_OTHER): Payer: Worker's Compensation | Admitting: Physician Assistant

## 2014-10-19 ENCOUNTER — Encounter: Payer: Self-pay | Admitting: Physician Assistant

## 2014-10-19 VITALS — BP 104/70 | HR 71 | Resp 16

## 2014-10-19 DIAGNOSIS — M79642 Pain in left hand: Secondary | ICD-10-CM | POA: Diagnosis not present

## 2014-10-19 DIAGNOSIS — S61012A Laceration without foreign body of left thumb without damage to nail, initial encounter: Secondary | ICD-10-CM

## 2014-10-19 NOTE — Progress Notes (Signed)
[facility] [address], [place] [address] [phone]  Date:  10/19/2014   Name:  [name]   DOB:  1980/06/04   MRN:  [id number]  PCP:  No PCP Per Patient    Chief Complaint: Wound Care   History of Present Illness:  This is a 34 y.o. female who is presenting for wound care of a left thumb laceration that occurred on 10/16/14. She cut off the tip of her left thumb along with a tiny piece of her nail. 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 Xeroform was placed. She has not removed dressing since. She states the thumb is sore. She denies fever or chills.  Review of Systems:  Review of Systems See HPI  Allergies  Allergen Reactions  . Morphine And Related Itching and Nausea And Vomiting    Medication list has been reviewed and updated.  Physical Examination:  Physical Exam  Constitutional: She is oriented to person, place, and time. She appears well-developed and well-nourished. No distress.  HENT:  Head: Normocephalic and atraumatic.  Right Ear: Hearing normal.  Left Ear: Hearing normal.  Nose: Nose normal.  Eyes: Conjunctivae and lids are normal. Right eye exhibits no discharge. Left eye exhibits no discharge. No scleral icterus.  Pulmonary/Chest: Effort normal. No respiratory distress.  Musculoskeletal: Normal range of motion.  Neurological: She is alert and oriented to person, place, and time.  Skin: Skin is warm and dry.  Finger soaked so dressing could be pulled off. Xeroform in place. No evidence infection. Wound very ttp. Wound dried and redressed.  Psychiatric: She has a normal mood and affect. Her speech is normal and behavior is normal. Thought content normal.   BP 104/70 mmHg  Pulse 71  Resp 16  SpO2 96%  Assessment and Plan:  1. Laceration of thumb, left, initial encounter Wound without infection but still very tender. Xeroform in place. She will change dressings at least every 24 hours. Daily soaks until xeroform  falls off. Keep finger clean and dry while at work. Return in 1 week for wound check or sooner if needed.   Roswell Miners Dyke Brackett, MHS [facility]  10/19/2014

## 2014-10-19 NOTE — Patient Instructions (Signed)
Change dressing at least every 24 hours. Soak wound daily until xeroform falls off. May leave wound open to air while at home. Return in 7 days for wound check.

## 2014-10-27 ENCOUNTER — Ambulatory Visit (INDEPENDENT_AMBULATORY_CARE_PROVIDER_SITE_OTHER): Payer: Worker's Compensation | Admitting: Family Medicine

## 2014-10-27 VITALS — BP 122/78 | HR 70 | Temp 98.7°F | Resp 16 | Ht 66.0 in | Wt 139.0 lb

## 2014-10-27 DIAGNOSIS — S61002D Unspecified open wound of left thumb without damage to nail, subsequent encounter: Secondary | ICD-10-CM

## 2014-10-27 DIAGNOSIS — S61012D Laceration without foreign body of left thumb without damage to nail, subsequent encounter: Secondary | ICD-10-CM

## 2014-10-27 NOTE — Progress Notes (Signed)
[name] 1981-01-29 34 y.o.   Chief Complaint  Patient presents with  . Follow-up    woud care/ cut on thumb     Date of Injury: 10/16/2014  History of Present Illness:  Presents for evaluation of work-related complaint. Cut the tip of her left thumb on a knife at work- we applied xeroform gauze and some is still stuck on her thumb She is a food stylist-   ROS    Allergies  Allergen Reactions  . Morphine And Related Itching and Nausea And Vomiting     Current medications reviewed and updated. Past medical history, family history, social history have been reviewed and updated.   Physical Exam   GEN: WDWN, NAD, Non-toxic, Alert & Oriented x 3 HEENT: Atraumatic, Normocephalic.  Ears and Nose: No external deformity. EXTR: No clubbing/cyanosis/edema NEURO: Normal gait.  PSYCH: Normally interactive. Conversant. Not depressed or anxious appearing.  Calm demeanor.  Left thumb:  She has an avulsion laceration over the distal left thumb, part of the nail is avulsed.  She still has a bit of gauze attached and it is painful to pull on the gauze.   Normal ROM of the thumb. No sign of infection  Trimmed xeroform gauze Assessment and Plan:  Laceration of left thumb, subsequent encounter  Healing laceration of left thumb.  Reassured that it appears to be doing well.  She does not have to follow-up with [place] unless she has any other concerns Continue soaks and try applying vaseline.   Let us know if not continuing to heal

## 2014-10-27 NOTE — Patient Instructions (Signed)
Your thumb appears to be doing ok- continue to soak and try applying some vaseline to the area to help loosen the gauze.   If you have any concerns let us know- however I think the gauze will come off within the next week

## 2016-11-09 ENCOUNTER — Ambulatory Visit (INDEPENDENT_AMBULATORY_CARE_PROVIDER_SITE_OTHER): Payer: Managed Care, Other (non HMO) | Admitting: Family Medicine

## 2016-11-09 VITALS — BP 119/74 | HR 73 | Temp 98.4°F | Resp 16 | Ht 66.0 in | Wt 153.0 lb

## 2016-11-09 DIAGNOSIS — N390 Urinary tract infection, site not specified: Secondary | ICD-10-CM

## 2016-11-09 LAB — POCT URINALYSIS DIP (MANUAL ENTRY)
BILIRUBIN UA: NEGATIVE mg/dL
Bilirubin, UA: NEGATIVE
GLUCOSE UA: NEGATIVE mg/dL
Nitrite, UA: NEGATIVE
Protein Ur, POC: NEGATIVE mg/dL
Spec Grav, UA: 1.015 (ref 1.010–1.025)
Urobilinogen, UA: 0.2 E.U./dL
pH, UA: 7 (ref 5.0–8.0)

## 2016-11-09 MED ORDER — NITROFURANTOIN MONOHYD MACRO 100 MG PO CAPS: 100.0000 mg | ORAL_CAPSULE | Freq: Two times a day (BID) | ORAL | 0 refills | Status: AC

## 2016-11-09 NOTE — Patient Instructions (Signed)
     IF you received an x-ray today, you will receive an invoice from [facility]. Please contact [facility] at [phone] with questions or concerns regarding your invoice.   IF you received labwork today, you will receive an invoice from [facility]. Please contact [facility] at [phone] with questions or concerns regarding your invoice.   Our billing staff will not be able to assist you with questions regarding bills from these companies.  You will be contacted with the lab results as soon as they are available. The fastest way to get your results is to activate your My Chart account. Instructions are located on the last page of this paperwork. If you have not heard from us regarding the results in 2 weeks, please contact this office.     

## 2016-11-09 NOTE — Progress Notes (Signed)
Chief Complaint  Patient presents with  . Urinary Tract Infection    possible uti    HPI  Urinary Tract Infection: Patient complains of frequency, incomplete bladder emptying and suprapubic pressure She has had symptoms starting at 1pm. Patient denies back pain, fever and vaginal discharge. Patient does not have a history of recurrent UTI.  Patient does not have a history of pyelonephritis.   No past medical history on file.  Current Outpatient Prescriptions  Medication Sig Dispense Refill  . COD LIVER OIL PO Take 1 tablet by mouth daily.    . Docosahexaenoic Acid (DHA PO) Take 1 tablet by mouth daily.    Marland Kitchen ibuprofen (ADVIL,MOTRIN) 800 MG tablet Take 1 tablet (800 mg total) by mouth every 8 (eight) hours as needed. (Patient not taking: Reported on 10/16/2014) 50 tablet 1  . nitrofurantoin, macrocrystal-monohydrate, (MACROBID) 100 MG capsule Take 1 capsule (100 mg total) by mouth 2 (two) times daily. 14 capsule 0  . oxyCODONE-acetaminophen (ROXICET) 5-325 MG per tablet Take 1 tablet by mouth every 4 (four) hours as needed for severe pain (The patient has taken Percocet in the past and tolerates this medication well.). (Patient not taking: Reported on 10/16/2014) 30 tablet 0  . Prenatal Vit-Fe Fumarate-FA (PRENATAL MULTIVITAMIN) TABS tablet Take 1 tablet by mouth daily at 12 noon.    . promethazine (PHENERGAN) 12.5 MG tablet Take 1 tablet (12.5 mg total) by mouth every 6 (six) hours as needed for nausea or vomiting. (Patient not taking: Reported on 10/16/2014) 30 tablet 0  . traMADol (ULTRAM) 50 MG tablet Take 1 tablet (50 mg total) by mouth every 8 (eight) hours as needed. (Patient not taking: Reported on 10/27/2014) 12 tablet 0   No current facility-administered medications for this visit.     Allergies:  Allergies  Allergen Reactions  . Morphine And Related Itching and Nausea And Vomiting    Past Surgical History:  Procedure Laterality Date  . CESAREAN SECTION    . DILATION AND  CURETTAGE OF UTERUS N/A 02/05/2014   Procedure: Suction DILATATION AND CURETTAGE;  Surgeon: Kirkland Hun, MD;  Location: [facility];  Service: Gynecology;  Laterality: N/A;    Social History   Social History  . Marital status: Single    Spouse name: N/A  . Number of children: N/A  . Years of education: N/A   Social History Main Topics  . Smoking status: Never Smoker  . Smokeless tobacco: Never Used  . Alcohol use Yes     Comment: rarely  . Drug use: Yes    Types: Marijuana     Comment:  6 months ago  . Sexual activity: Yes   Other Topics Concern  . Not on file   Social History Narrative  . No narrative on file    ROS Review of Systems See HPI Constitution: No fevers or chills No malaise No diaphoresis Skin: No rash or itching Eyes: no blurry vision, no double vision GU: no dysuria or hematuria Neuro: no dizziness or headaches  Objective: Vitals:   11/09/16 1713  BP: 119/74  Pulse: 73  Resp: 16  Temp: 98.4 F (36.9 C)  TempSrc: Oral  SpO2: 98%  Weight: 153 lb (69.4 kg)  Height: 5\' 6"  (1.676 m)    Physical Exam Physical Exam  Constitutional: She is oriented to person, place, and time. She appears well-developed and well-nourished.  HENT:  Head: Normocephalic and atraumatic.  Eyes: Conjunctivae and EOM are normal.  Cardiovascular: Normal rate, regular rhythm and normal  heart sounds.   Pulmonary/Chest: Effort normal and breath sounds normal. No respiratory distress. She has no wheezes.  Abdominal: Normal appearance and bowel sounds are normal. There is no tenderness. There is no CVA tenderness.  Neurological: She is alert and oriented to person, place, and time.    Assessment and Plan Gordy Councilmanlexandra was seen today for urinary tract infection.  Diagnoses and all orders for this visit:  Urinary tract infection without hematuria, site unspecified -     POCT urinalysis dipstick  Other orders -     nitrofurantoin, macrocrystal-monohydrate, (MACROBID) 100 MG  capsule; Take 1 capsule (100 mg total) by mouth 2 (two) times daily.   Large LE and microscopic blood Empiric treatment with macrobid  Roni Scow A Treylon Henard

## 2016-12-16 ENCOUNTER — Encounter: Payer: Self-pay | Admitting: Family Medicine

## 2016-12-16 ENCOUNTER — Ambulatory Visit (INDEPENDENT_AMBULATORY_CARE_PROVIDER_SITE_OTHER): Payer: Managed Care, Other (non HMO) | Admitting: Family Medicine

## 2016-12-16 VITALS — BP 102/62 | HR 91 | Temp 97.7°F | Resp 16 | Ht 66.14 in | Wt 152.0 lb

## 2016-12-16 DIAGNOSIS — J029 Acute pharyngitis, unspecified: Secondary | ICD-10-CM

## 2016-12-16 LAB — POCT RAPID STREP A (OFFICE): Rapid Strep A Screen: POSITIVE — AB

## 2016-12-16 MED ORDER — AMOXICILLIN 500 MG PO CAPS: 500.0000 mg | ORAL_CAPSULE | Freq: Two times a day (BID) | ORAL | 0 refills | Status: DC

## 2016-12-16 NOTE — Progress Notes (Signed)
10/5/20181:52 PM  [name] 1980/10/14, 36 y.o. female [id number]  Chief Complaint  Patient presents with  . Sore Throat    with pain and stiffness in the neck x 3 days    HPI:   Patient is a 36 y.o. female who presents today for 3 days of sore throat, painful to swallow, tender lymph nodes, right sided neck soreness and stiffness, had a headache yesterday and some nausea but that has resolved, she had cols sweats last night, daughter was sick last week with GI bug.  Depression screen [facility] 2/9 12/16/2016 11/09/2016 10/27/2014  Decreased Interest 0 0 0  Down, Depressed, Hopeless 0 0 0  PHQ - 2 Score 0 0 0    Allergies  Allergen Reactions  . Morphine And Related Itching and Nausea And Vomiting    Prior to Admission medications   Not on File    History reviewed. No pertinent past medical history.  Past Surgical History:  Procedure Laterality Date  . CESAREAN SECTION    . DILATION AND CURETTAGE OF UTERUS N/A 02/05/2014   Procedure: Suction DILATATION AND CURETTAGE;  Surgeon: Kirkland Hun, MD;  Location: [facility];  Service: Gynecology;  Laterality: N/A;    Social History  Substance Use Topics  . Smoking status: Never Smoker  . Smokeless tobacco: Never Used  . Alcohol use Yes     Comment: rarely    History reviewed. No pertinent family history.  Review of Systems  Constitutional: Positive for chills, diaphoresis and malaise/fatigue. Negative for fever.  HENT: Positive for sore throat. Negative for congestion and ear pain.   Respiratory: Negative for cough and shortness of breath.   Gastrointestinal: Positive for nausea. Negative for abdominal pain, diarrhea and vomiting.  Musculoskeletal: Positive for neck pain.  Neurological: Positive for headaches.     OBJECTIVE:  Blood pressure 102/62, pulse 91, temperature 97.7 F (36.5 C), temperature source Oral, resp. rate 16, height 5' 6.14" (1.68 m), weight 152 lb (68.9 kg), last menstrual period 12/05/2016,  SpO2 95 %.  Physical Exam  Constitutional: She is oriented to person, place, and time and well-developed, well-nourished, and in no distress.  HENT:  Head: Normocephalic and atraumatic.  Right Ear: Hearing, tympanic membrane, external ear and ear canal normal.  Left Ear: Hearing, tympanic membrane, external ear and ear canal normal.  Mouth/Throat: Mucous membranes are normal. Oropharyngeal exudate present.  Eyes: Pupils are equal, round, and reactive to light. EOM are normal.  Neck: Normal range of motion. Neck supple. Muscular tenderness present. No neck rigidity. No Brudzinski's sign and no Kernig's sign noted.  Cardiovascular: Normal rate, regular rhythm and normal heart sounds.  Exam reveals no gallop and no friction rub.   No murmur heard. Pulmonary/Chest: Effort normal and breath sounds normal. She has no wheezes. She has no rales.  Lymphadenopathy:    She has cervical adenopathy.  Neurological: She is alert and oriented to person, place, and time. Gait normal.  Skin: Skin is warm and dry.    Results for orders placed or performed in visit on 12/16/16 (from the past 24 hour(s))  POCT rapid strep A     Status: Abnormal   Collection Time: 12/16/16  1:52 PM  Result Value Ref Range   Rapid Strep A Screen Positive (A) Negative      ASSESSMENT and PLAN 1. Acute pharyngitis, unspecified etiology Routine care instructions and precautions given. Started on amoxicillin. Work excuse given. Patient educational handout given.   - POCT rapid strep A -  positive.  Meds ordered this encounter  Medications  . amoxicillin (AMOXIL) 500 MG capsule    Sig: Take 1 capsule (500 mg total) by mouth 2 (two) times daily.    Dispense:  20 capsule    Refill:  0    Return if symptoms worsen or fail to improve.    Myles Lipps, MD Primary Care at [facility] [address] [place], [place] [address] Ph.  [phone] Fax [phone]

## 2016-12-16 NOTE — Patient Instructions (Addendum)
   IF you received an x-ray today, you will receive an invoice from [facility]. Please contact [facility] at [phone] with questions or concerns regarding your invoice.   IF you received labwork today, you will receive an invoice from [facility]. Please contact [facility] at [phone] with questions or concerns regarding your invoice.   Our billing staff will not be able to assist you with questions regarding bills from these companies.  You will be contacted with the lab results as soon as they are available. The fastest way to get your results is to activate your My Chart account. Instructions are located on the last page of this paperwork. If you have not heard from us regarding the results in 2 weeks, please contact this office.     Strep Throat Strep throat is a bacterial infection of the throat. Your health care provider may call the infection tonsillitis or pharyngitis, depending on whether there is swelling in the tonsils or at the back of the throat. Strep throat is most common during the cold months of the year in children who are 5-15 years of age, but it can happen during any season in people of any age. This infection is spread from person to person (contagious) through coughing, sneezing, or close contact. What are the causes? Strep throat is caused by the bacteria called Streptococcus pyogenes. What increases the risk? This condition is more likely to develop in:  People who spend time in crowded places where the infection can spread easily.  People who have close contact with someone who has strep throat.  What are the signs or symptoms? Symptoms of this condition include:  Fever or chills.  Redness, swelling, or pain in the tonsils or throat.  Pain or difficulty when swallowing.  White or yellow spots on the tonsils or throat.  Swollen, tender glands in the neck or under the jaw.  Red rash all over the body (rare).  How is this  diagnosed? This condition is diagnosed by performing a rapid strep test or by taking a swab of your throat (throat culture test). Results from a rapid strep test are usually ready in a few minutes, but throat culture test results are available after one or two days. How is this treated? This condition is treated with antibiotic medicine. Follow these instructions at home: Medicines  Take over-the-counter and prescription medicines only as told by your health care provider.  Take your antibiotic as told by your health care provider. Do not stop taking the antibiotic even if you start to feel better.  Have family members who also have a sore throat or fever tested for strep throat. They may need antibiotics if they have the strep infection. Eating and drinking  Do not share food, drinking cups, or personal items that could cause the infection to spread to other people.  If swallowing is difficult, try eating soft foods until your sore throat feels better.  Drink enough fluid to keep your urine clear or pale yellow. General instructions  Gargle with a salt-water mixture 3-4 times per day or as needed. To make a salt-water mixture, completely dissolve -1 tsp of salt in 1 cup of warm water.  Make sure that all household members wash their hands well.  Get plenty of rest.  Stay home from school or work until you have been taking antibiotics for 24 hours.  Keep all follow-up visits as told by your health care provider. This is important. Contact a health care provider   if:  The glands in your neck continue to get bigger.  You develop a rash, cough, or earache.  You cough up a thick liquid that is green, yellow-brown, or bloody.  You have pain or discomfort that does not get better with medicine.  Your problems seem to be getting worse rather than better.  You have a fever. Get help right away if:  You have new symptoms, such as vomiting, severe headache, stiff or painful neck,  chest pain, or shortness of breath.  You have severe throat pain, drooling, or changes in your voice.  You have swelling of the neck, or the skin on the neck becomes red and tender.  You have signs of dehydration, such as fatigue, dry mouth, and decreased urination.  You become increasingly sleepy, or you cannot wake up completely.  Your joints become red or painful. This information is not intended to replace advice given to you by your health care provider. Make sure you discuss any questions you have with your health care provider. Document Released: 02/26/2000 Document Revised: 10/28/2015 Document Reviewed: 06/23/2014 Elsevier Interactive Patient Education  2017 Elsevier Inc.  

## 2017-01-24 ENCOUNTER — Encounter: Payer: Self-pay | Admitting: Family Medicine

## 2018-10-08 ENCOUNTER — Encounter: Payer: Self-pay | Admitting: Family Medicine

## 2018-10-08 ENCOUNTER — Other Ambulatory Visit: Payer: Self-pay

## 2018-10-08 ENCOUNTER — Ambulatory Visit: Payer: Self-pay

## 2018-10-08 ENCOUNTER — Ambulatory Visit: Payer: Managed Care, Other (non HMO) | Admitting: Family Medicine

## 2018-10-08 VITALS — BP 120/80 | Ht 66.0 in | Wt 160.0 lb

## 2018-10-08 DIAGNOSIS — M84362A Stress fracture, left tibia, initial encounter for fracture: Secondary | ICD-10-CM

## 2018-10-08 DIAGNOSIS — M25562 Pain in left knee: Secondary | ICD-10-CM | POA: Diagnosis not present

## 2018-10-08 DIAGNOSIS — M84362D Stress fracture, left tibia, subsequent encounter for fracture with routine healing: Secondary | ICD-10-CM | POA: Insufficient documentation

## 2018-10-08 NOTE — Progress Notes (Signed)
PCP: Patient, No Pcp Per  Subjective:   HPI: Patient is a 38 y.o. female here for left lower medial shin pain x 2-3 weeks. Hurts primarily when she runs and will linger for a day after a run. Also tender to touch. She runs 12 miles/week, 2-3 miles at a time. She notes she transitioned from using a treadmill to running in her neighborhood back in the [place]. She also notes she increased her distance per run as well (went from 2-3 miles/run to 6 miles). She did this for ~2 weeks and would be sore afterwards. She felt this longer distance was "too much". She notes she hit that area of her shin with her boot very hard ~9 months ago and it was very tender for a while, but that was the only trauma associated to the area. She hasn't ran in ~1 week and the pain is improved. She does not get the pain when she walks. Has not treated it with anything.  Review of Systems:  Per HPI.   [place], medications and smoking status reviewed.      Objective:  Physical Exam:  Gen: awake, alert, NAD, comfortable in exam room Pulm: breathing unlabored  Left Knee: - Inspection: no gross deformity. No swelling/effusion, erythema or bruising. Skin intact - Palpation: no TTP - ROM: full active ROM with flexion and extension in knee and hip - Strength: 5/5 strength - Neuro/vasc: NV intact  Ankle/Foot, left: No visible erythema, swelling, ecchymosis, or bony deformity. Range of motion is full in all directions. Strength is 5/5 in all directions. No tenderness at the insertion/body/myotendinous junction of the Achilles tendon; No peroneal tendon tenderness or subluxation; No tenderness on posterior aspects of lateral and medial malleolus; Stable lateral and medial ligaments; Unremarkable squeeze test; Talar dome nontender; Unremarkable calcaneal squeeze; No plantar calcaneal tenderness; No tenderness over the navicular prominence; No tenderness over cuboid; No pain at base of 5th MT; No tenderness at the distal  metatarsals;  Able to walk 4 steps.   Left LE:  Inspection: No visible erythema, swelling, ecchymosis, or bony deformity along anterior/posterior tibial bone. Tender to palpation along medial distal tibia. Fulcrum test negative. Hop test positive.  Right LE: Inspection: No visible erythema, swelling, ecchymosis, or bony deformity along anterior/posterior tibial bone.  Palpation: Nontender  MSK ultrasound: left distal tibia Images were obtained both in the transverse and longitudinal plane. Findings: Cortical irregularity distal medial tibia at area of pain.  Overlying fluid collection, mild neovascularity as well. Impression: Stress fracture to left medial distal tibia with surrounding fluid  Ultrasound and interpretation by Dr. Tarry Kos and Karlton Lemon, MD   Assessment & Plan:   Stress fracture of left tibia History, physical exam, and ultrasound findings consistent with distal tibial stress fracture. Discussed conservative management including air cast x 2 weeks with ambulation and rest with ice 15 mins 3x/day. Tylenol/Ibuprofen for pain. RTC in 2 weeks, sooner if worsening.    Orders Placed This Encounter  Procedures   [place] LIMITED JOINT SPACE STRUCTURES LOW LEFT    Standing Status:   Future    Number of Occurrences:   1    Standing Expiration Date:   12/07/2019    Order Specific Question:   Reason for Exam (SYMPTOM  OR DIAGNOSIS REQUIRED)    Answer:   leg pain    Order Specific Question:   Preferred imaging location?    Answer:   Internal    No orders of the defined types were placed in  this encounter.   Orpah CobbKiersten Nicolasa Milbrath, DO [facility], PGY2 10/08/2018 10:02 AM

## 2018-10-08 NOTE — Assessment & Plan Note (Signed)
History, physical exam, and ultrasound findings consistent with distal tibial stress fracture. Discussed conservative management including air cast x 2 weeks with ambulation and rest with ice 15 mins 3x/day. Tylenol/Ibuprofen for pain. RTC in 2 weeks, sooner if worsening.

## 2018-10-08 NOTE — Patient Instructions (Addendum)
You have a stress fracture of your distal tibia. Wear the aircast when up and walking around the next two weeks. Icing 15 minutes at a time 3-4 times a day. Tylenol, ibuprofen only if needed for pain. Focus on upper body, core work for next 2 weeks. Follow up with me in 2 weeks - if you're doing well we can advance you to cycling with low resistance, swimming at that time. These take 12 weeks total to heal usually but you should start return to running program in as soon as 4 weeks in the aircast depending on your improvement.

## 2018-10-22 ENCOUNTER — Other Ambulatory Visit: Payer: Self-pay

## 2018-10-22 ENCOUNTER — Ambulatory Visit (INDEPENDENT_AMBULATORY_CARE_PROVIDER_SITE_OTHER): Payer: Managed Care, Other (non HMO) | Admitting: Family Medicine

## 2018-10-22 ENCOUNTER — Encounter: Payer: Self-pay | Admitting: Family Medicine

## 2018-10-22 VITALS — BP 128/88

## 2018-10-22 DIAGNOSIS — M84362D Stress fracture, left tibia, subsequent encounter for fracture with routine healing: Secondary | ICD-10-CM

## 2018-10-22 NOTE — Progress Notes (Signed)
PCP: Patient, No Pcp Per  Subjective:  CC: shin pain, left  HPI: Patient is a 38 y.o. female here for follow-up of left lower shin pain.  Patient was seen in the office 10/08/2018 for 2-3 weeks of left lower shin pain, that was found to be due to anterior tibial stress fracture as seen on ultrasound.  The patient was instructed to treat her pain with Tylenol/ibuprofen, wearing an Aircast, and to stop running for 2 weeks.  Patient states she has minimal pain at the site of the fracture in the mornings with palpation of the tibia and with taking her first few steps.  She states the pain "feels like a bruise", but it goes away.  She denies any pain with walking or going up or down stairs.  Otherwise, she denies fevers, headaches, chest pain, shortness of breath, abdominal pain, sensory loss, or weakness in either lower extremity.  She is curious about her next steps in healing and whether or not she can exercise.  No past medical history on file.  Current Outpatient Medications on File Prior to Visit  Medication Sig Dispense Refill  . amoxicillin (AMOXIL) 500 MG capsule Take 1 capsule (500 mg total) by mouth 2 (two) times daily. 20 capsule 0   No current facility-administered medications on file prior to visit.     Past Surgical History:  Procedure Laterality Date  . CESAREAN SECTION    . DILATION AND CURETTAGE OF UTERUS N/A 02/05/2014   Procedure: Suction DILATATION AND CURETTAGE;  Surgeon: Kirkland HunArthur Stringer, MD;  Location: [facility];  Service: Gynecology;  Laterality: N/A;    Allergies  Allergen Reactions  . Morphine And Related Itching and Nausea And Vomiting    Social History   Socioeconomic History  . Marital status: Single    Spouse name: Not on file  . Number of children: Not on file  . Years of education: Not on file  . Highest education level: Not on file  Occupational History  . Not on file  Social Needs  . Financial resource strain: Not on file  . Food insecurity    Worry:  Not on file    Inability: Not on file  . Transportation needs    Medical: Not on file    Non-medical: Not on file  Tobacco Use  . Smoking status: Never Smoker  . Smokeless tobacco: Never Used  Substance and Sexual Activity  . Alcohol use: Yes    Comment: rarely  . Drug use: Yes    Types: Marijuana    Comment:  6 months ago  . Sexual activity: Yes  Lifestyle  . Physical activity    Days per week: Not on file    Minutes per session: Not on file  . Stress: Not on file  Relationships  . Social Musicianconnections    Talks on phone: Not on file    Gets together: Not on file    Attends religious service: Not on file    Active member of club or organization: Not on file    Attends meetings of clubs or organizations: Not on file    Relationship status: Not on file  . Intimate partner violence    Fear of current or ex partner: Not on file    Emotionally abused: Not on file    Physically abused: Not on file    Forced sexual activity: Not on file  Other Topics Concern  . Not on file  Social History Narrative  . Not on file  No family history on file.  BP 128/88   Review of Systems: See HPI above.     Objective:  Physical Exam:  Gen: NAD, comfortable in exam room Respiratory: Normal work of breathing, speaking in complete sentences  Left lower extremity: No deformity or injury, minimal tenderness to palpation over distal one fourth of the tibia where fracture is located, no tenderness to palpation of left knee or left ankle, no pain elicited with ankle plantar and dorsi flexion, FROM.  5/5 strength noted with ankle plantar and dorsi flexion, patient able to hop up and down on left leg without pain, 2+ TP and DP pulses, brisk capillary refill, sensation intact throughout  Assessment & Plan:  1. Left distal tibial stress fracture: patient has been wearing air cast, avoiding running. States she is feeling better and fracture site is less tender than previously. Able to hop on left  leg without eliciting pain. -Patient provided with information regarding resuming exercise after tibial stress fracture (source: UpToDate)     Milus Banister, [place], PGY-2 10/22/2018 10:49 AM

## 2018-11-21 ENCOUNTER — Encounter: Payer: Self-pay | Admitting: Family Medicine

## 2018-11-21 ENCOUNTER — Other Ambulatory Visit: Payer: Self-pay

## 2018-11-21 ENCOUNTER — Ambulatory Visit: Payer: Managed Care, Other (non HMO) | Admitting: Family Medicine

## 2018-11-21 VITALS — BP 110/78 | Ht 66.0 in | Wt 160.0 lb

## 2018-11-21 DIAGNOSIS — M84362D Stress fracture, left tibia, subsequent encounter for fracture with routine healing: Secondary | ICD-10-CM | POA: Diagnosis not present

## 2018-11-21 NOTE — Progress Notes (Signed)
PCP: Patient, No Pcp Per  Subjective:   CC: Left shin pain, follow-up   HPI: Patient is a 38 y.o. female here for follow-up of left tibial stress fracture. Patient was seen on on 10/08/18 with 2-3 weeks of left lower shin pain, was diagnosed with anterior tibial stress fracture via U/S at that time.  7/27: Patient is a 38 y.o. female here for left lower medial shin pain x 2-3 weeks. Hurts primarily when she runs and will linger for a day after a run. Also tender to touch. She runs 12 miles/week, 2-3 miles at a time. She notes she transitioned from using a treadmill to running in her neighborhood back in the [place]. She also notes she increased her distance per run as well (went from 2-3 miles/run to 6 miles). She did this for ~2 weeks and would be sore afterwards. She felt this longer distance was "too much". She notes she hit that area of her shin with her boot very hard ~9 months ago and it was very tender for a while, but that was the only trauma associated to the area. She hasn't ran in ~1 week and the pain is improved. She does not get the pain when she walks. Has not treated it with anything.  8/10: The patient was instructed to treat her pain with Tylenol/ibuprofen, wearing an Aircast, and to stop running for 2 weeks.  Patient states she has minimal pain at the site of the fracture in the mornings with palpation of the tibia and with taking her first few steps.  She states the pain "feels like a bruise", but it goes away.  She denies any pain with walking or going up or down stairs.  Otherwise, she denies fevers, headaches, chest pain, shortness of breath, abdominal pain, sensory loss, or weakness in either lower extremity.  She is curious about her next steps in healing and whether or not she can exercise.  Today: Patient states that overall her pain is slightly improving, however she has been unable to return to gradual exercise without pain.  She no longer has any pain in the shin first  thing in the morning and reports less point tenderness to palpation. However, she has attempted to perform the 438m jog about once per week over the last 3 to 4 weeks, and has had to stop each time due to pain.  She states her pain is a 3-4/10 with this activity.  She also states that she was wearing the Aircast for the first 2 weeks, however had not been wearing the Aircast over the last 2 weeks at all.  She has been able to walk a few miles a day as well as ride her bike without any pain or difficulty.  She is no longer taking Tylenol/ibuprofen.  She denies any numbness/tingling, or weakness.  No past medical history on file.  Current Outpatient Medications on File Prior to Visit  Medication Sig Dispense Refill  . amoxicillin (AMOXIL) 500 MG capsule Take 1 capsule (500 mg total) by mouth 2 (two) times daily. 20 capsule 0   No current facility-administered medications on file prior to visit.     Past Surgical History:  Procedure Laterality Date  . CESAREAN SECTION    . DILATION AND CURETTAGE OF UTERUS N/A 02/05/2014   Procedure: Suction DILATATION AND CURETTAGE;  Surgeon: Ena Dawley, MD;  Location: [facility];  Service: Gynecology;  Laterality: N/A;    Allergies  Allergen Reactions  . Morphine And Related Itching and  Nausea And Vomiting    Social History   Socioeconomic History  . Marital status: Single    Spouse name: Not on file  . Number of children: Not on file  . Years of education: Not on file  . Highest education level: Not on file  Occupational History  . Not on file  Social Needs  . Financial resource strain: Not on file  . Food insecurity    Worry: Not on file    Inability: Not on file  . Transportation needs    Medical: Not on file    Non-medical: Not on file  Tobacco Use  . Smoking status: Never Smoker  . Smokeless tobacco: Never Used  Substance and Sexual Activity  . Alcohol use: Yes    Comment: rarely  . Drug use: Yes    Types: Marijuana    Comment:  6  months ago  . Sexual activity: Yes  Lifestyle  . Physical activity    Days per week: Not on file    Minutes per session: Not on file  . Stress: Not on file  Relationships  . Social Musicianconnections    Talks on phone: Not on file    Gets together: Not on file    Attends religious service: Not on file    Active member of club or organization: Not on file    Attends meetings of clubs or organizations: Not on file    Relationship status: Not on file  . Intimate partner violence    Fear of current or ex partner: Not on file    Emotionally abused: Not on file    Physically abused: Not on file    Forced sexual activity: Not on file  Other Topics Concern  . Not on file  Social History Narrative  . Not on file    No family history on file.  BP 110/78   Ht 5\' 6"  (1.676 m)   Wt 160 lb (72.6 kg)   BMI 25.82 kg/m   Review of Systems: See HPI above, otherwise negative. No fever/chills, dyspnea.     Objective:  Physical Exam:  Gen: NAD, comfortable in exam room Respiratory: Breathing unlabored, in no respiratory distress Psych: Appropriate mood and affect  MSK:  LLE: -Inspection: No gross deformity; no swelling; skin intact without erythema or bruising -Palpation: + TTP focally distal one fourth of anteriormedial tibia; no step-off noted; no TTP of left knee or ankle -ROM: Full active and passive range of motion in knee and ankle joint -Strength/stability: 5/5 strength noted with knee flexion/extension and ankle dorsiflexion/plantarflexion without pain -Sensory/vascular: Sensation to light touch intact; NVI; 2/4 DP palpated -Provocative testing: Able to perform hop test without pain  MSK Ultrasound - Left Tibia -Tibia visualized in both transverse and longitudinal plane - healing stress fracture of distal 1/4th anterior tibia identified with overlying callus formation; no appreciable cortical irregularity or malalignment - presence of mild neovascularity and minimal overlying  fluid collection   IMPRESSION: Healing stress fracture left distal tibia   Assessment & Plan:  1.  Left distal tibial stress fracture Signs of healing identified on ultrasound as well as less pain in TTP on exam. However patient has failed to progress in advancement of rehab program due to pain.  She has not been wearing Aircast normally or with activity over the last 2 weeks.  -Reassured patient that her stress fracture has evidence of healing, even with lack of advancement with rehab -Recommended patient wait one week, then restart the  417m/400m section (Phase 2) of rehab protocol with aircast in place.  She may then continue to advance in the rehab protocol phases as tolerated if without notable pain -Ice after activity; Tylenol PRN -Advised patient to call/send message to notify physician of progress -Follow-up in 4 weeks in clinic -See rehab protocol as below:    Jamie Kato, DO PGY-2  Addendum:  Patient seen and examined with resident.  Agree with his note and findings.  Patient has had trouble advancing her activity but then noted she's not been using aircast with her weight-bearing activities/return to run protocol - stressed importance of this.  Wait a week then restart with 400/484m.  Let us know how she's doing with this.  Icing, tylenol if needed.  Cross train on off days.

## 2018-11-21 NOTE — Patient Instructions (Signed)
Wait a week then restart the 43m/400m section of the protocol but in the [place], tylenol if needed. Give me a call or send me a mychart message to let me know how you're doing especially if you're having problems. Follow up with me in 4 weeks. If you still have problems with this I think we need to have you only cross train for 3-4 weeks until I see you back.

## 2018-12-24 ENCOUNTER — Ambulatory Visit: Payer: Managed Care, Other (non HMO) | Admitting: Family Medicine

## 2018-12-25 ENCOUNTER — Other Ambulatory Visit: Payer: Self-pay

## 2018-12-25 ENCOUNTER — Ambulatory Visit: Payer: Managed Care, Other (non HMO) | Admitting: Family Medicine

## 2018-12-25 VITALS — BP 110/84 | Ht 66.0 in | Wt 160.0 lb

## 2018-12-25 DIAGNOSIS — M84362D Stress fracture, left tibia, subsequent encounter for fracture with routine healing: Secondary | ICD-10-CM

## 2018-12-25 NOTE — Progress Notes (Signed)
[name] is a 38 y.o. female who presents to [facility] today for follow up of left anterior tibial stress fracture on 7/27, visualized on [place] at that time:  7/27: Patient is a37 y.o.femalehere for left lower medial shin pain x 2-3 weeks. Hurts primarily when she runs and will linger for a day after a run. Also tender to touch. She runs 12 miles/week, 2-3 miles at a time. She notes she transitioned from using a treadmill to running in her neighborhood back in the [place]. She also notes she increased her distance per run as well (went from 2-3 miles/run to 6 miles). She did this for ~2 weeks and would be sore afterwards. She felt this longer distance was "too much". She notes she hit that area of her shin with her boot very hard ~9 months ago and it was very tender for a while, but that was the only trauma associated to the area. She hasn't ran in ~1 week and the pain is improved. She does not get the pain when she walks. Has not treated it with anything.  8/10: The patient was instructed to treat her pain with Tylenol/ibuprofen, wearing an Aircast, and to stop running for 2 weeks. Patient states she has minimal pain at the site of the fracture in the mornings with palpation of the tibia and with taking her first few steps. She states the pain "feels like a bruise", but it goes away. She denies any pain with walking or going up or down stairs. Otherwise, she denies fevers, headaches, chest pain, shortness of breath, abdominal pain, sensory loss, orweakness in either lower extremity. She is curious about her next steps in healing and whether or not she can exercise.  9/9: Patient states that overall her pain is slightly improving, however she has been unable to return to gradual exercise without pain.  She no longer has any pain in the shin first thing in the morning and reports less point tenderness to palpation. However, she has attempted to perform the 461m jog about once per week over the last  3 to 4 weeks, and has had to stop each time due to pain.  She states her pain is a 3-4/10 with this activity.  She also states that she was wearing the Aircast for the first 2 weeks, however had not been wearing the Aircast over the last 2 weeks at all.  She has been able to walk a few miles a day as well as ride her bike without any pain or difficulty.  She is no longer taking Tylenol/ibuprofen.  She denies any numbness/tingling, or weakness.  10/13: Still wearing air cast when doing intervals.  Starting last week, hasn't been wearing it day-to-day.  Has been doing 4108m interval walking and running and building up the distance.  Has no pain at all during activity, but can 'feel it.'  Up to running three miles, only endorses mild soreness afterwards.  Does this twice a week, no exercise the other days.  Overall, thinks it is much better.  Also been riding bike still no pain, and no pain with just walking.  Has not needed any OTC medications for pain.  Feels slight tenderness upon palpation to the affected area.  Has not been icing the area after running.   PMH reviewed.  ROS as above. Medications reviewed.  Exam:  BP 110/84   Ht 5\' 6"  (1.676 m)   Wt 160 lb (72.6 kg)   BMI 25.82 kg/m  Gen: Well  NAD MSK:  LLE: Inspection: No visible erythema or swelling of bilateral knees. Palpation: mild soreness with palpation of distal 1/4th of anteriomedial tibia, no step-off noted, no TTP remaining length of tibia, ankle, or knee.  No TTP right tibia ROM: Range of motion is full in all directions bilaterally. Strength: Strength is 5/5 in all directions, including hip flexion, knee flexion/extension, dorsiflexion, plantarflexion, inversion, and eversion of ankle without pain bilaterally N/V: Sensation intact to light touch bilaterally, 2+ dorsalis pedis pulses palpated Special Test: Negative hop test on left Gait: Walks with normal gait, runs with pronation L>R  Limited Left Lower Extremity US: Tibia  visualized in both transverse and longitudinal place.   Callus formation over distal 1/4th of anterior tibia without evidence of cortical disruption or malalignment.  No overlying fluid collection.   No results found.   Assessment and Plan: 1) Stress fracture, left tibia, subsequent encounter for fracture with routine healing History, exam, and [place] consistent with well-healing stress fracture.  Patient progressing through Tibia Stress Fracture Rehab Pathway and without pain at present.  Advised to follow up prn and to continue rehab protocol.  Given pronation while running, advised OTC orthotics to correct.  She notes only two stress fractures in her lifetime, therefore do not think custom orthotics for correction are needed at this time, as she is compensating for the pronation well.  Also advised continuing to take Vit D and Calcium supplements.   [place]zona Constable, D.O.  PGY-2 Family Medicine  12/25/2018 9:13 AM

## 2018-12-25 NOTE — Assessment & Plan Note (Signed)
History, exam, and [place] consistent with well-healing stress fracture.  Patient progressing through Tibia Stress Fracture Rehab Pathway and without pain at present.  Advised to follow up prn and to continue rehab protocol.  Given pronation while running, advised OTC orthotics to correct.  She notes only two stress fractures in her lifetime, therefore do not think custom orthotics for correction are needed at this time, as she is compensating for the pronation well.  Also advised continuing to take Vit D and Calcium supplements.

## 2018-12-25 NOTE — Patient Instructions (Signed)
You're doing great! Continue following the protocol as directed. Then only increase mileage by about 10% per week until you meet your goal. Icing, tylenol if needed. Calcium, vitamin D as you have been. Consider arch support (spencos, superfeet, dr. Zoe Lan active series). Follow up with me as needed - call me if you have any problems.

## 2018-12-27 ENCOUNTER — Encounter: Payer: Self-pay | Admitting: Family Medicine

## 2020-03-17 ENCOUNTER — Other Ambulatory Visit: Payer: Self-pay | Admitting: Obstetrics and Gynecology

## 2020-03-17 DIAGNOSIS — Z1231 Encounter for screening mammogram for malignant neoplasm of breast: Secondary | ICD-10-CM

## 2020-04-23 ENCOUNTER — Other Ambulatory Visit: Payer: Self-pay

## 2020-04-23 ENCOUNTER — Ambulatory Visit
Admission: RE | Admit: 2020-04-23 | Discharge: 2020-04-23 | Disposition: A | Discharge status: Home / Self Care | Payer: Managed Care, Other (non HMO) | Source: Ambulatory Visit | Attending: Obstetrics and Gynecology | Admitting: Obstetrics and Gynecology

## 2020-04-23 DIAGNOSIS — Z1231 Encounter for screening mammogram for malignant neoplasm of breast: Secondary | ICD-10-CM

## 2021-05-07 IMAGING — MG MM DIGITAL SCREENING BILAT W/ TOMO AND CAD
8 series · 9 of 24 positions shown · non-contrast
Comparison: Previous exam(s).

CLINICAL DATA: Screening.

EXAM:
DIGITAL SCREENING BILATERAL MAMMOGRAM WITH TOMOSYNTHESIS AND CAD
TECHNIQUE: Bilateral screening digital craniocaudal and mediolateral oblique
mammograms were obtained. Bilateral screening digital breast
tomosynthesis was performed. The images were evaluated with
computer-aided detection.

[L CC synth-2D]
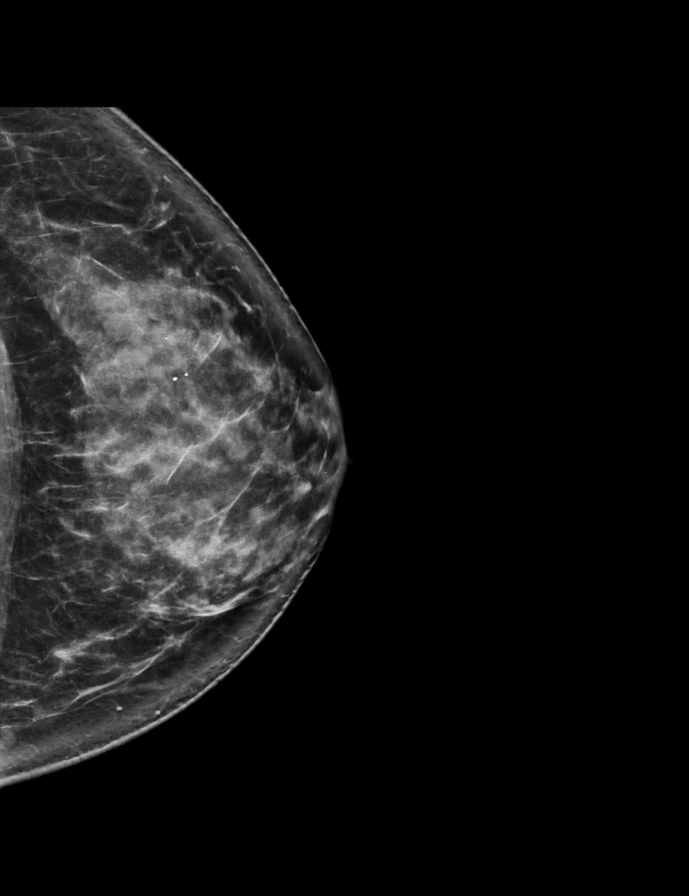

[R CC synth-2D]
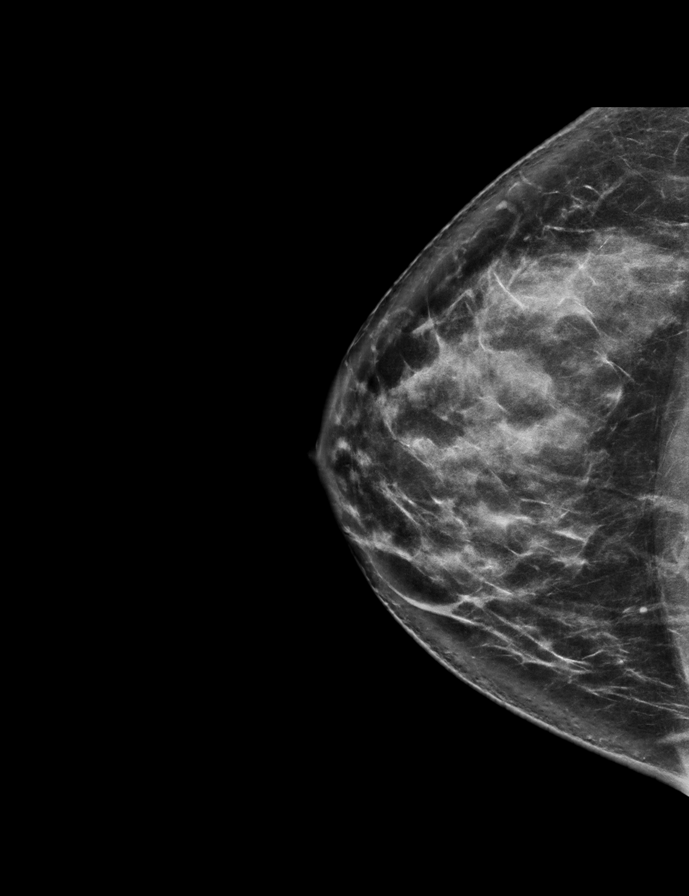

[R MLO synth-2D]
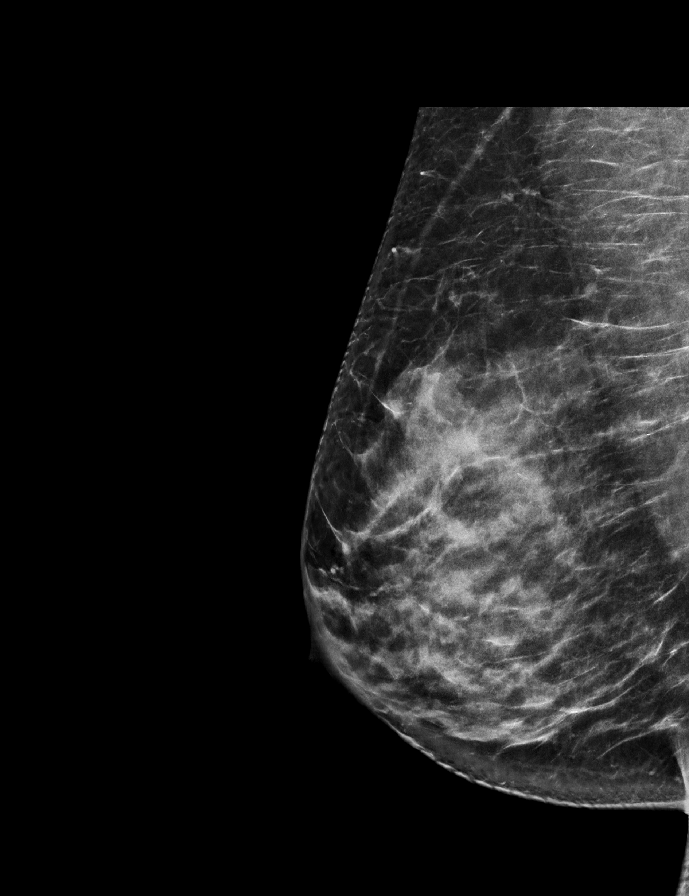

[L MLO synth-2D]
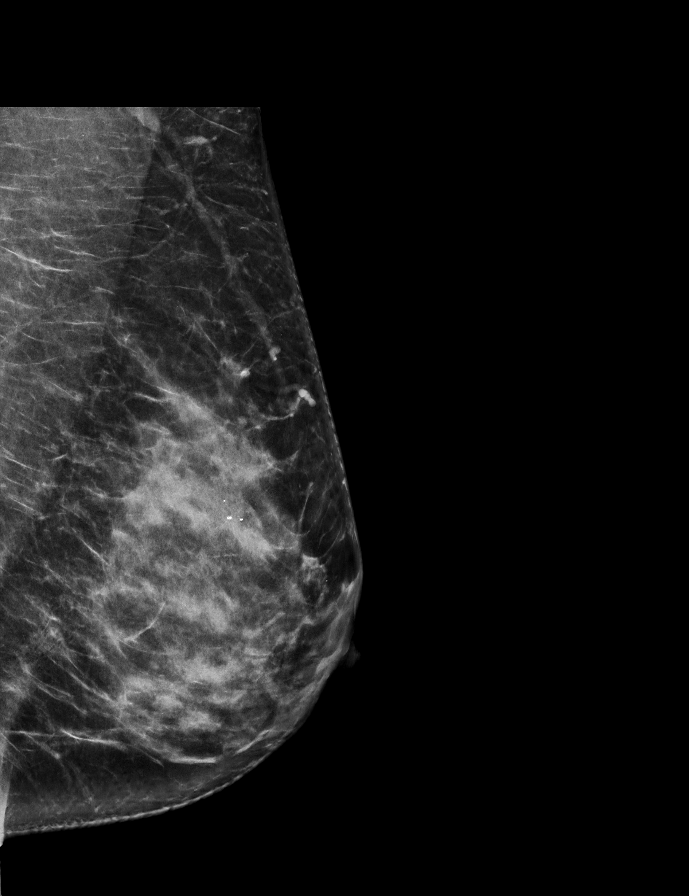

[L CC tomo · 2 of 73 frames shown]
[frame 24/73]
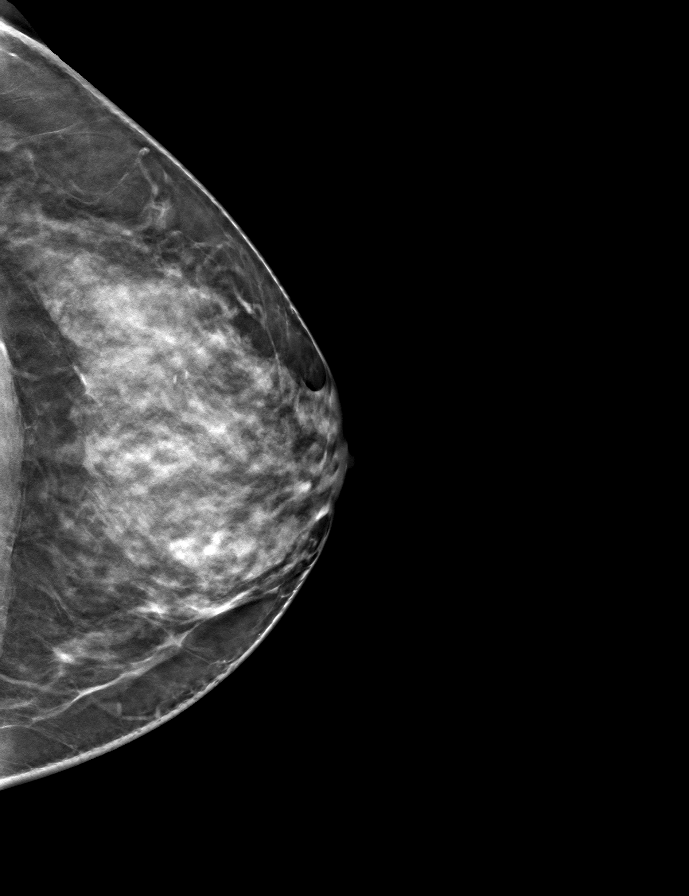
[frame 37/73]
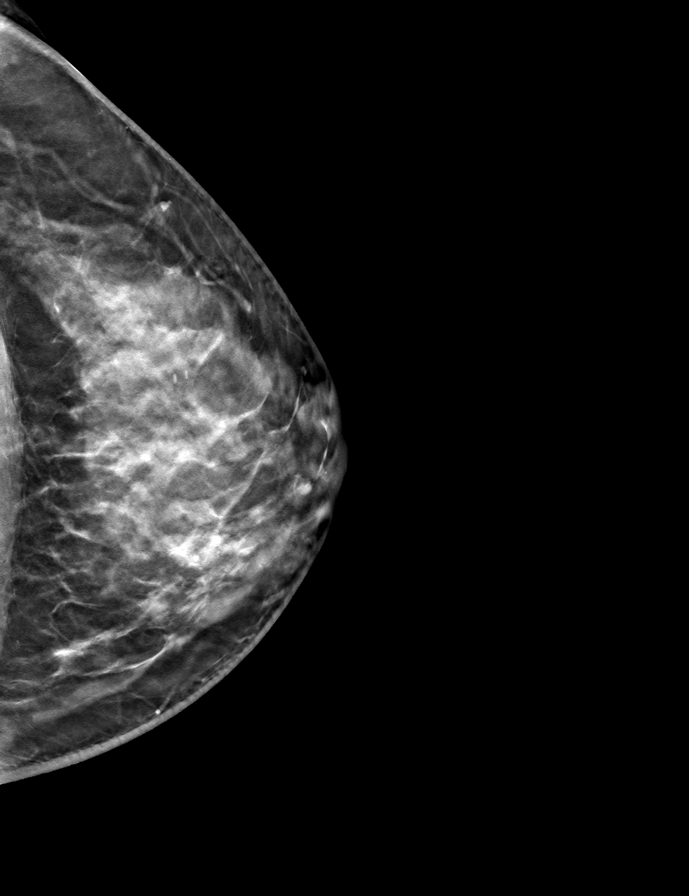

[L MLO tomo · tomo slice 37/74.0]
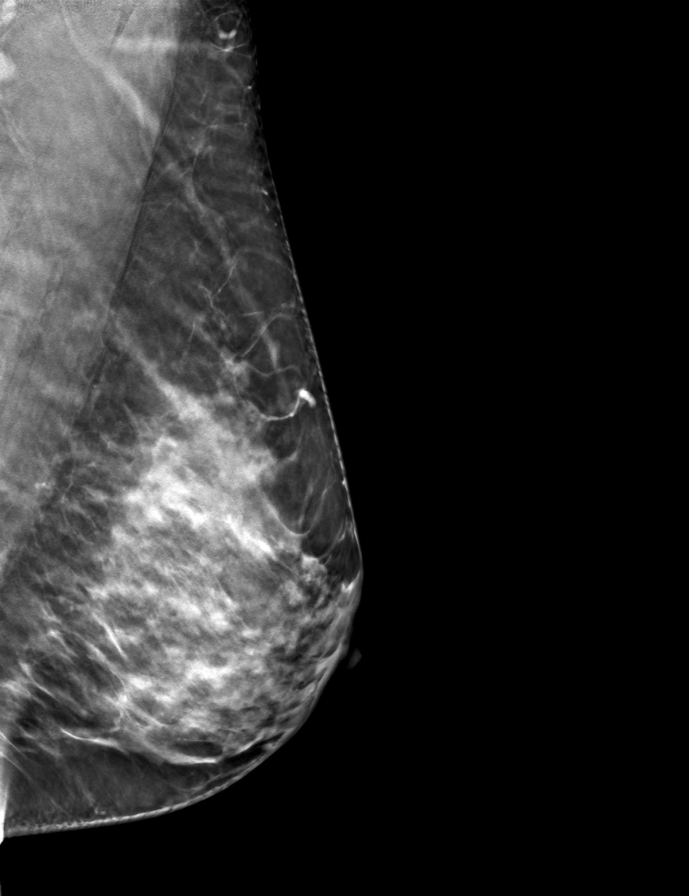

[R MLO tomo · tomo slice 37/74.0]
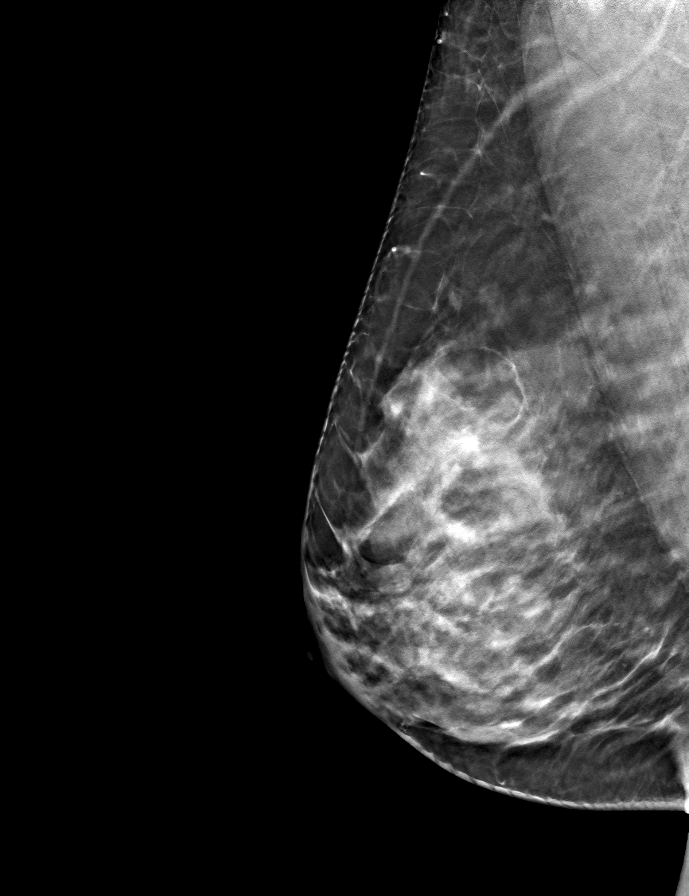

[R CC tomo · tomo slice 39/78.0]
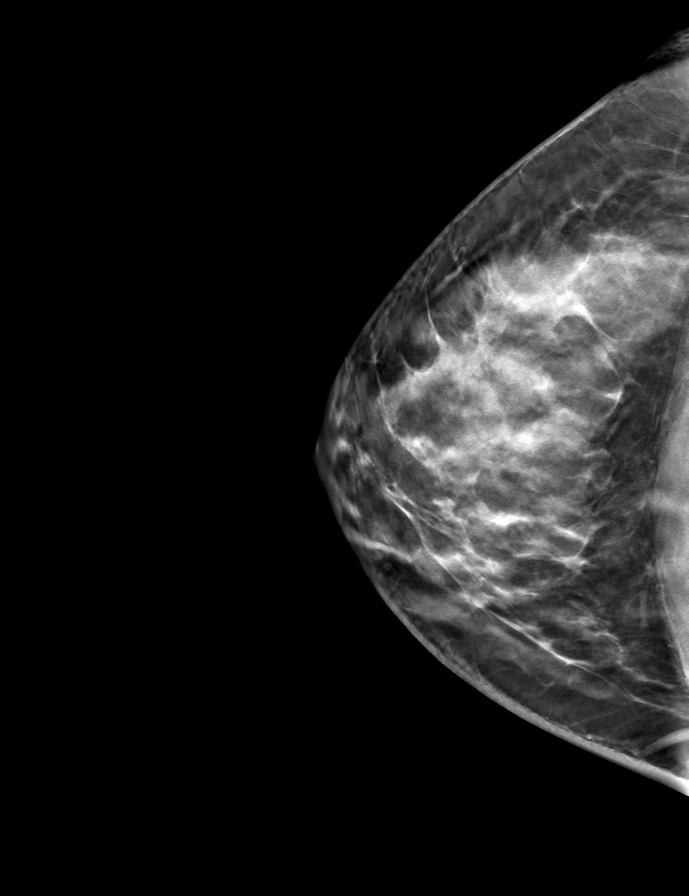

[9 of 24 positions shown; findings below may reference images not displayed]

ACR Breast Density Category c: The breast tissue is heterogeneously
dense, which may obscure small masses.
FINDINGS: There are no findings suspicious for malignancy.
IMPRESSION: No mammographic evidence of malignancy. A result letter of this
screening mammogram will be mailed directly to the patient.

RECOMMENDATION:
Screening mammogram in one year. (Code:[id number]-W-BC3)

BI-RADS CATEGORY  1: Negative.

## 2023-05-05 ENCOUNTER — Other Ambulatory Visit: Payer: Self-pay | Admitting: Obstetrics and Gynecology

## 2023-05-05 DIAGNOSIS — Z1231 Encounter for screening mammogram for malignant neoplasm of breast: Secondary | ICD-10-CM

## 2023-06-07 ENCOUNTER — Ambulatory Visit
Admission: RE | Admit: 2023-06-07 | Discharge: 2023-06-07 | Disposition: A | Discharge status: Home / Self Care | Source: Ambulatory Visit | Attending: Obstetrics and Gynecology | Admitting: Obstetrics and Gynecology

## 2023-06-07 DIAGNOSIS — Z1231 Encounter for screening mammogram for malignant neoplasm of breast: Secondary | ICD-10-CM
